# Patient Record
Sex: Male | Born: 1935 | Race: White | Hispanic: No | Smoking: Never smoker
Health system: Southern US, Community
[De-identification: ages and names within clinical notes are randomized; demographics above are authoritative.]

## PROBLEM LIST (undated history)

## (undated) DIAGNOSIS — I639 Cerebral infarction, unspecified: Secondary | ICD-10-CM

## (undated) DIAGNOSIS — E1165 Type 2 diabetes mellitus with hyperglycemia: Secondary | ICD-10-CM

## (undated) DIAGNOSIS — N401 Enlarged prostate with lower urinary tract symptoms: Secondary | ICD-10-CM

## (undated) DIAGNOSIS — R2681 Unsteadiness on feet: Secondary | ICD-10-CM

## (undated) DIAGNOSIS — F32A Depression, unspecified: Secondary | ICD-10-CM

## (undated) DIAGNOSIS — F329 Major depressive disorder, single episode, unspecified: Secondary | ICD-10-CM

## (undated) DIAGNOSIS — E785 Hyperlipidemia, unspecified: Secondary | ICD-10-CM

## (undated) DIAGNOSIS — G510 Bell's palsy: Secondary | ICD-10-CM

## (undated) DIAGNOSIS — M4850XA Collapsed vertebra, not elsewhere classified, site unspecified, initial encounter for fracture: Secondary | ICD-10-CM

## (undated) DIAGNOSIS — I739 Peripheral vascular disease, unspecified: Secondary | ICD-10-CM

## (undated) DIAGNOSIS — I2510 Atherosclerotic heart disease of native coronary artery without angina pectoris: Secondary | ICD-10-CM

## (undated) DIAGNOSIS — N183 Chronic kidney disease, stage 3 unspecified: Secondary | ICD-10-CM

## (undated) DIAGNOSIS — IMO0002 Reserved for concepts with insufficient information to code with codable children: Secondary | ICD-10-CM

## (undated) DIAGNOSIS — M1990 Unspecified osteoarthritis, unspecified site: Secondary | ICD-10-CM

## (undated) DIAGNOSIS — I10 Essential (primary) hypertension: Secondary | ICD-10-CM

## (undated) DIAGNOSIS — M159 Polyosteoarthritis, unspecified: Secondary | ICD-10-CM

## (undated) DIAGNOSIS — G4733 Obstructive sleep apnea (adult) (pediatric): Secondary | ICD-10-CM

## (undated) DIAGNOSIS — E039 Hypothyroidism, unspecified: Secondary | ICD-10-CM

## (undated) DIAGNOSIS — N529 Male erectile dysfunction, unspecified: Secondary | ICD-10-CM

## (undated) DIAGNOSIS — C801 Malignant (primary) neoplasm, unspecified: Secondary | ICD-10-CM

## (undated) DIAGNOSIS — N138 Other obstructive and reflux uropathy: Secondary | ICD-10-CM

## (undated) DIAGNOSIS — J8410 Pulmonary fibrosis, unspecified: Secondary | ICD-10-CM

## (undated) HISTORY — DX: Cerebral infarction, unspecified: I63.9

## (undated) HISTORY — DX: Malignant (primary) neoplasm, unspecified: C80.1

## (undated) HISTORY — DX: Obstructive sleep apnea (adult) (pediatric): G47.33

## (undated) HISTORY — DX: Unsteadiness on feet: R26.81

## (undated) HISTORY — DX: Major depressive disorder, single episode, unspecified: F32.9

## (undated) HISTORY — PX: APPENDECTOMY: SHX54

## (undated) HISTORY — DX: Peripheral vascular disease, unspecified: I73.9

## (undated) HISTORY — DX: Essential (primary) hypertension: I10

## (undated) HISTORY — DX: Collapsed vertebra, not elsewhere classified, site unspecified, initial encounter for fracture: M48.50XA

## (undated) HISTORY — DX: Pulmonary fibrosis, unspecified: J84.10

## (undated) HISTORY — DX: Hyperlipidemia, unspecified: E78.5

## (undated) HISTORY — DX: Other obstructive and reflux uropathy: N13.8

## (undated) HISTORY — DX: Bell's palsy: G51.0

## (undated) HISTORY — DX: Unspecified osteoarthritis, unspecified site: M19.90

## (undated) HISTORY — DX: Male erectile dysfunction, unspecified: N52.9

## (undated) HISTORY — DX: Hypothyroidism, unspecified: E03.9

## (undated) HISTORY — DX: Polyosteoarthritis, unspecified: M15.9

## (undated) HISTORY — DX: Chronic kidney disease, stage 3 (moderate): N18.3

## (undated) HISTORY — DX: Depression, unspecified: F32.A

## (undated) HISTORY — DX: Atherosclerotic heart disease of native coronary artery without angina pectoris: I25.10

## (undated) HISTORY — DX: Benign prostatic hyperplasia with lower urinary tract symptoms: N40.1

## (undated) HISTORY — PX: CHOLECYSTECTOMY: SHX55

## (undated) HISTORY — DX: Chronic kidney disease, stage 3 unspecified: N18.30

---

## 2000-08-11 ENCOUNTER — Emergency Department (HOSPITAL_COMMUNITY): Admission: EM | Admit: 2000-08-11 | Discharge: 2000-08-11 | Payer: Self-pay | Admitting: *Deleted

## 2000-08-11 ENCOUNTER — Encounter: Payer: Self-pay | Admitting: Emergency Medicine

## 2002-06-12 HISTORY — PX: COLONOSCOPY: SHX174

## 2004-01-05 ENCOUNTER — Emergency Department (HOSPITAL_COMMUNITY): Admission: EM | Admit: 2004-01-05 | Discharge: 2004-01-05 | Payer: Self-pay | Admitting: Emergency Medicine

## 2004-03-24 ENCOUNTER — Ambulatory Visit: Payer: Self-pay | Admitting: Family Medicine

## 2004-10-09 ENCOUNTER — Ambulatory Visit: Payer: Self-pay | Admitting: Family Medicine

## 2004-10-14 ENCOUNTER — Ambulatory Visit: Payer: Self-pay | Admitting: Family Medicine

## 2005-05-19 ENCOUNTER — Ambulatory Visit: Payer: Self-pay | Admitting: Family Medicine

## 2005-06-14 ENCOUNTER — Ambulatory Visit: Payer: Self-pay | Admitting: Internal Medicine

## 2005-07-01 ENCOUNTER — Ambulatory Visit: Payer: Self-pay | Admitting: Family Medicine

## 2005-08-19 ENCOUNTER — Ambulatory Visit: Payer: Self-pay | Admitting: Family Medicine

## 2005-10-21 ENCOUNTER — Inpatient Hospital Stay (HOSPITAL_COMMUNITY): Admission: EM | Admit: 2005-10-21 | Discharge: 2005-10-30 | Payer: Self-pay | Admitting: Emergency Medicine

## 2005-10-21 ENCOUNTER — Encounter (INDEPENDENT_AMBULATORY_CARE_PROVIDER_SITE_OTHER): Payer: Self-pay | Admitting: Specialist

## 2005-11-11 ENCOUNTER — Ambulatory Visit: Payer: Self-pay | Admitting: Family Medicine

## 2006-02-01 ENCOUNTER — Ambulatory Visit: Payer: Self-pay | Admitting: Family Medicine

## 2006-02-16 ENCOUNTER — Ambulatory Visit: Payer: Self-pay | Admitting: Family Medicine

## 2006-05-10 ENCOUNTER — Ambulatory Visit: Payer: Self-pay | Admitting: Family Medicine

## 2006-05-10 LAB — CONVERTED CEMR LAB
AST: 32 units/L (ref 0–37)
Basophils Relative: 0.4 % (ref 0.0–1.0)
CO2: 29 meq/L (ref 19–32)
Calcium: 9.4 mg/dL (ref 8.4–10.5)
Chol/HDL Ratio, serum: 4.2
Cholesterol: 162 mg/dL (ref 0–200)
Eosinophil percent: 3.1 % (ref 0.0–5.0)
HDL: 38.6 mg/dL — ABNORMAL LOW (ref >39.0)
LDL Cholesterol: 98 mg/dL (ref 0–99)
MCHC: 33.9 g/dL (ref 30.0–36.0)
MCV: 90.2 fL (ref 78.0–100.0)
Monocytes Absolute: 0.7 10*3/uL (ref 0.2–0.7)
Monocytes Relative: 12.5 % — ABNORMAL HIGH (ref 3.0–11.0)
Neutro Abs: 3.4 10*3/uL (ref 1.4–7.7)
TSH: 1.69 microintl units/mL (ref 0.35–5.50)
Triglyceride fasting, serum: 127 mg/dL (ref 0–149)
VLDL: 25 mg/dL (ref 0–40)
WBC: 5.5 10*3/uL (ref 4.5–10.5)

## 2006-08-28 ENCOUNTER — Emergency Department (HOSPITAL_COMMUNITY): Admission: EM | Admit: 2006-08-28 | Discharge: 2006-08-28 | Payer: Self-pay | Admitting: Emergency Medicine

## 2007-01-25 DIAGNOSIS — F329 Major depressive disorder, single episode, unspecified: Secondary | ICD-10-CM | POA: Insufficient documentation

## 2007-01-25 DIAGNOSIS — F3289 Other specified depressive episodes: Secondary | ICD-10-CM | POA: Insufficient documentation

## 2007-01-26 ENCOUNTER — Ambulatory Visit: Payer: Self-pay | Admitting: Family Medicine

## 2007-01-26 DIAGNOSIS — M1990 Unspecified osteoarthritis, unspecified site: Secondary | ICD-10-CM | POA: Insufficient documentation

## 2007-02-22 ENCOUNTER — Ambulatory Visit: Payer: Self-pay | Admitting: Family Medicine

## 2007-02-22 DIAGNOSIS — M5380 Other specified dorsopathies, site unspecified: Secondary | ICD-10-CM | POA: Insufficient documentation

## 2007-02-22 LAB — CONVERTED CEMR LAB
Protein, U semiquant: NEGATIVE
Urobilinogen, UA: 0.2

## 2007-05-18 ENCOUNTER — Ambulatory Visit: Payer: Self-pay | Admitting: Family Medicine

## 2007-05-18 DIAGNOSIS — E039 Hypothyroidism, unspecified: Secondary | ICD-10-CM | POA: Insufficient documentation

## 2007-05-18 DIAGNOSIS — E785 Hyperlipidemia, unspecified: Secondary | ICD-10-CM | POA: Insufficient documentation

## 2007-05-18 DIAGNOSIS — Z87898 Personal history of other specified conditions: Secondary | ICD-10-CM

## 2007-05-30 ENCOUNTER — Ambulatory Visit: Payer: Self-pay | Admitting: Family Medicine

## 2007-05-31 ENCOUNTER — Ambulatory Visit: Payer: Self-pay | Admitting: Family Medicine

## 2007-05-31 DIAGNOSIS — E119 Type 2 diabetes mellitus without complications: Secondary | ICD-10-CM | POA: Insufficient documentation

## 2007-05-31 LAB — CONVERTED CEMR LAB
Hgb A1c MFr Bld: 10.6 % — ABNORMAL HIGH (ref 4.6–6.0)
PSA: 0.44 ng/mL (ref 0.10–4.00)

## 2007-06-06 ENCOUNTER — Encounter: Payer: Self-pay | Admitting: Family Medicine

## 2007-06-26 ENCOUNTER — Encounter: Payer: Self-pay | Admitting: Family Medicine

## 2007-06-26 ENCOUNTER — Encounter: Admission: RE | Admit: 2007-06-26 | Discharge: 2007-07-12 | Payer: Self-pay | Admitting: Family Medicine

## 2007-07-12 ENCOUNTER — Encounter: Payer: Self-pay | Admitting: Family Medicine

## 2007-07-13 ENCOUNTER — Telehealth: Payer: Self-pay | Admitting: Family Medicine

## 2007-07-19 ENCOUNTER — Telehealth: Payer: Self-pay | Admitting: Family Medicine

## 2007-08-03 ENCOUNTER — Ambulatory Visit: Payer: Self-pay | Admitting: Family Medicine

## 2007-08-03 DIAGNOSIS — E669 Obesity, unspecified: Secondary | ICD-10-CM | POA: Insufficient documentation

## 2007-08-04 LAB — CONVERTED CEMR LAB: Hgb A1c MFr Bld: 7.7 % — ABNORMAL HIGH (ref 4.6–6.0)

## 2007-08-24 ENCOUNTER — Ambulatory Visit: Payer: Self-pay | Admitting: Family Medicine

## 2007-08-24 ENCOUNTER — Encounter: Admission: RE | Admit: 2007-08-24 | Discharge: 2007-08-24 | Payer: Self-pay | Admitting: Family Medicine

## 2007-08-24 DIAGNOSIS — IMO0002 Reserved for concepts with insufficient information to code with codable children: Secondary | ICD-10-CM

## 2007-08-24 DIAGNOSIS — S22009A Unspecified fracture of unspecified thoracic vertebra, initial encounter for closed fracture: Secondary | ICD-10-CM | POA: Insufficient documentation

## 2007-08-26 ENCOUNTER — Encounter: Admission: RE | Admit: 2007-08-26 | Discharge: 2007-08-26 | Payer: Self-pay | Admitting: Family Medicine

## 2007-08-28 ENCOUNTER — Telehealth: Payer: Self-pay | Admitting: Family Medicine

## 2007-08-28 ENCOUNTER — Encounter: Payer: Self-pay | Admitting: Family Medicine

## 2007-09-13 ENCOUNTER — Encounter: Payer: Self-pay | Admitting: Family Medicine

## 2007-09-26 ENCOUNTER — Ambulatory Visit: Payer: Self-pay | Admitting: Family Medicine

## 2007-09-26 DIAGNOSIS — R0602 Shortness of breath: Secondary | ICD-10-CM | POA: Insufficient documentation

## 2007-10-02 ENCOUNTER — Ambulatory Visit: Payer: Self-pay

## 2007-10-02 ENCOUNTER — Encounter: Payer: Self-pay | Admitting: Family Medicine

## 2007-10-17 ENCOUNTER — Ambulatory Visit: Payer: Self-pay | Admitting: Family Medicine

## 2007-10-17 DIAGNOSIS — R0902 Hypoxemia: Secondary | ICD-10-CM

## 2007-10-18 ENCOUNTER — Ambulatory Visit: Payer: Self-pay | Admitting: Family Medicine

## 2007-10-24 ENCOUNTER — Telehealth: Payer: Self-pay | Admitting: Family Medicine

## 2007-10-25 ENCOUNTER — Encounter: Admission: RE | Admit: 2007-10-25 | Discharge: 2007-10-25 | Payer: Self-pay | Admitting: Family Medicine

## 2007-10-25 ENCOUNTER — Ambulatory Visit: Payer: Self-pay | Admitting: Family Medicine

## 2007-10-26 ENCOUNTER — Ambulatory Visit: Payer: Self-pay | Admitting: Cardiology

## 2007-10-26 LAB — CONVERTED CEMR LAB
BUN: 18 mg/dL (ref 6–23)
Creatinine, Ser: 0.9 mg/dL (ref 0.4–1.5)

## 2007-10-31 ENCOUNTER — Telehealth: Payer: Self-pay | Admitting: Family Medicine

## 2007-11-01 ENCOUNTER — Ambulatory Visit: Payer: Self-pay | Admitting: Pulmonary Disease

## 2007-11-01 DIAGNOSIS — R930 Abnormal findings on diagnostic imaging of skull and head, not elsewhere classified: Secondary | ICD-10-CM

## 2007-11-02 ENCOUNTER — Telehealth (INDEPENDENT_AMBULATORY_CARE_PROVIDER_SITE_OTHER): Payer: Self-pay | Admitting: *Deleted

## 2007-11-09 ENCOUNTER — Ambulatory Visit: Payer: Self-pay | Admitting: Family Medicine

## 2007-11-09 LAB — CONVERTED CEMR LAB: Glucose, Bld: 117 mg/dL — ABNORMAL HIGH (ref 70–99)

## 2007-11-14 ENCOUNTER — Encounter: Payer: Self-pay | Admitting: Family Medicine

## 2007-11-16 ENCOUNTER — Ambulatory Visit: Payer: Self-pay | Admitting: Family Medicine

## 2007-12-07 ENCOUNTER — Ambulatory Visit: Payer: Self-pay | Admitting: Pulmonary Disease

## 2007-12-07 DIAGNOSIS — G4710 Hypersomnia, unspecified: Secondary | ICD-10-CM | POA: Insufficient documentation

## 2007-12-27 ENCOUNTER — Ambulatory Visit (HOSPITAL_BASED_OUTPATIENT_CLINIC_OR_DEPARTMENT_OTHER): Admission: RE | Admit: 2007-12-27 | Discharge: 2007-12-27 | Payer: Self-pay | Admitting: Pulmonary Disease

## 2007-12-27 ENCOUNTER — Encounter: Payer: Self-pay | Admitting: Pulmonary Disease

## 2008-01-16 ENCOUNTER — Telehealth (INDEPENDENT_AMBULATORY_CARE_PROVIDER_SITE_OTHER): Payer: Self-pay | Admitting: *Deleted

## 2008-01-21 ENCOUNTER — Ambulatory Visit: Payer: Self-pay | Admitting: Pulmonary Disease

## 2008-01-22 ENCOUNTER — Telehealth (INDEPENDENT_AMBULATORY_CARE_PROVIDER_SITE_OTHER): Payer: Self-pay | Admitting: *Deleted

## 2008-01-26 ENCOUNTER — Ambulatory Visit: Payer: Self-pay | Admitting: Pulmonary Disease

## 2008-01-26 DIAGNOSIS — G4733 Obstructive sleep apnea (adult) (pediatric): Secondary | ICD-10-CM

## 2008-02-12 ENCOUNTER — Telehealth (INDEPENDENT_AMBULATORY_CARE_PROVIDER_SITE_OTHER): Payer: Self-pay | Admitting: *Deleted

## 2008-02-12 ENCOUNTER — Ambulatory Visit: Payer: Self-pay | Admitting: Family Medicine

## 2008-02-15 ENCOUNTER — Ambulatory Visit: Payer: Self-pay | Admitting: Pulmonary Disease

## 2008-02-15 ENCOUNTER — Ambulatory Visit: Payer: Self-pay | Admitting: Family Medicine

## 2008-02-15 DIAGNOSIS — M2240 Chondromalacia patellae, unspecified knee: Secondary | ICD-10-CM

## 2008-02-15 LAB — CONVERTED CEMR LAB: Hgb A1c MFr Bld: 5.8 % (ref 4.6–6.0)

## 2008-02-20 ENCOUNTER — Telehealth: Payer: Self-pay | Admitting: Family Medicine

## 2008-02-26 ENCOUNTER — Telehealth: Payer: Self-pay | Admitting: Pulmonary Disease

## 2008-02-29 ENCOUNTER — Encounter: Admission: RE | Admit: 2008-02-29 | Discharge: 2008-02-29 | Payer: Self-pay | Admitting: Family Medicine

## 2008-04-11 ENCOUNTER — Ambulatory Visit: Payer: Self-pay | Admitting: Family Medicine

## 2008-04-11 DIAGNOSIS — F528 Other sexual dysfunction not due to a substance or known physiological condition: Secondary | ICD-10-CM | POA: Insufficient documentation

## 2008-05-01 ENCOUNTER — Ambulatory Visit: Payer: Self-pay | Admitting: Family Medicine

## 2008-05-07 ENCOUNTER — Ambulatory Visit: Payer: Self-pay | Admitting: Family Medicine

## 2008-05-13 ENCOUNTER — Ambulatory Visit: Payer: Self-pay | Admitting: Family Medicine

## 2008-05-22 ENCOUNTER — Ambulatory Visit: Payer: Self-pay | Admitting: Family Medicine

## 2008-06-19 ENCOUNTER — Encounter: Admission: RE | Admit: 2008-06-19 | Discharge: 2008-06-19 | Payer: Self-pay | Admitting: Family Medicine

## 2008-08-05 ENCOUNTER — Ambulatory Visit: Payer: Self-pay | Admitting: Pulmonary Disease

## 2008-08-13 ENCOUNTER — Ambulatory Visit: Payer: Self-pay | Admitting: Family Medicine

## 2008-08-13 ENCOUNTER — Telehealth: Payer: Self-pay | Admitting: Family Medicine

## 2008-08-21 ENCOUNTER — Ambulatory Visit: Payer: Self-pay | Admitting: Family Medicine

## 2008-08-21 DIAGNOSIS — M129 Arthropathy, unspecified: Secondary | ICD-10-CM | POA: Insufficient documentation

## 2008-08-21 DIAGNOSIS — M7660 Achilles tendinitis, unspecified leg: Secondary | ICD-10-CM | POA: Insufficient documentation

## 2008-08-21 LAB — CONVERTED CEMR LAB: Hgb A1c MFr Bld: 5.8 % (ref 4.6–6.5)

## 2008-09-17 ENCOUNTER — Encounter: Admission: RE | Admit: 2008-09-17 | Discharge: 2008-09-17 | Payer: Self-pay | Admitting: Family Medicine

## 2008-09-19 ENCOUNTER — Ambulatory Visit: Payer: Self-pay | Admitting: Family Medicine

## 2008-09-19 DIAGNOSIS — T50995A Adverse effect of other drugs, medicaments and biological substances, initial encounter: Secondary | ICD-10-CM

## 2008-09-19 DIAGNOSIS — K625 Hemorrhage of anus and rectum: Secondary | ICD-10-CM

## 2008-09-19 LAB — CONVERTED CEMR LAB
Blood in Urine, dipstick: NEGATIVE
Glucose, Urine, Semiquant: NEGATIVE
Ketones, urine, test strip: NEGATIVE
Urobilinogen, UA: 0.2

## 2008-10-08 LAB — CONVERTED CEMR LAB
ALT: 26 units/L (ref 0–53)
AST: 22 units/L (ref 0–37)
Basophils Absolute: 0 10*3/uL (ref 0.0–0.1)
Chloride: 105 meq/L (ref 96–112)
Creatinine, Ser: 1 mg/dL (ref 0.4–1.5)
Creatinine,U: 104.1 mg/dL
Eosinophils Absolute: 0.2 10*3/uL (ref 0.0–0.7)
Hgb A1c MFr Bld: 6.4 % (ref 4.6–6.5)
Lymphocytes Relative: 18.3 % (ref 12.0–46.0)
Microalb Creat Ratio: 4.8 mg/g (ref 0.0–30.0)
Microalb, Ur: 0.5 mg/dL (ref 0.0–1.9)
Neutro Abs: 3.3 10*3/uL (ref 1.4–7.7)
PSA: 0.68 ng/mL (ref 0.10–4.00)
Platelets: 132 10*3/uL — ABNORMAL LOW (ref 150.0–400.0)
RBC: 4.33 M/uL (ref 4.22–5.81)
RDW: 12.1 % (ref 11.5–14.6)
Sodium: 142 meq/L (ref 135–145)
TSH: 1.12 microintl units/mL (ref 0.35–5.50)
Total CHOL/HDL Ratio: 4
Total Protein: 6.9 g/dL (ref 6.0–8.3)
Triglycerides: 72 mg/dL (ref 0.0–149.0)
VLDL: 14.4 mg/dL (ref 0.0–40.0)

## 2008-10-16 ENCOUNTER — Encounter: Payer: Self-pay | Admitting: Family Medicine

## 2008-10-16 LAB — CONVERTED CEMR LAB: Vit D, 25-Hydroxy: 21 ng/mL — ABNORMAL LOW (ref 30–89)

## 2009-01-07 ENCOUNTER — Ambulatory Visit: Payer: Self-pay | Admitting: Family Medicine

## 2009-02-27 ENCOUNTER — Ambulatory Visit: Payer: Self-pay | Admitting: Family Medicine

## 2009-03-05 LAB — CONVERTED CEMR LAB: Hgb A1c MFr Bld: 6.2 % (ref 4.6–6.5)

## 2009-04-08 ENCOUNTER — Ambulatory Visit: Payer: Self-pay | Admitting: Family Medicine

## 2009-04-15 ENCOUNTER — Ambulatory Visit: Payer: Self-pay | Admitting: Family Medicine

## 2009-04-15 DIAGNOSIS — M19079 Primary osteoarthritis, unspecified ankle and foot: Secondary | ICD-10-CM | POA: Insufficient documentation

## 2009-04-16 ENCOUNTER — Telehealth: Payer: Self-pay | Admitting: Family Medicine

## 2009-04-29 ENCOUNTER — Ambulatory Visit: Payer: Self-pay | Admitting: Family Medicine

## 2009-05-04 LAB — CONVERTED CEMR LAB: Hgb A1c MFr Bld: 6.2 % (ref 4.6–6.5)

## 2009-08-12 ENCOUNTER — Ambulatory Visit: Payer: Self-pay | Admitting: Family Medicine

## 2009-09-23 ENCOUNTER — Ambulatory Visit: Payer: Self-pay | Admitting: Family Medicine

## 2009-09-23 DIAGNOSIS — R05 Cough: Secondary | ICD-10-CM

## 2009-09-23 DIAGNOSIS — R350 Frequency of micturition: Secondary | ICD-10-CM

## 2009-09-23 DIAGNOSIS — E559 Vitamin D deficiency, unspecified: Secondary | ICD-10-CM | POA: Insufficient documentation

## 2009-09-23 DIAGNOSIS — R059 Cough, unspecified: Secondary | ICD-10-CM | POA: Insufficient documentation

## 2009-11-11 ENCOUNTER — Ambulatory Visit: Payer: Self-pay | Admitting: Family Medicine

## 2009-11-11 DIAGNOSIS — B354 Tinea corporis: Secondary | ICD-10-CM | POA: Insufficient documentation

## 2009-11-19 ENCOUNTER — Ambulatory Visit: Payer: Self-pay | Admitting: Family Medicine

## 2009-11-19 DIAGNOSIS — L2089 Other atopic dermatitis: Secondary | ICD-10-CM

## 2009-11-27 ENCOUNTER — Telehealth: Payer: Self-pay | Admitting: Family Medicine

## 2009-12-25 ENCOUNTER — Ambulatory Visit: Payer: Self-pay | Admitting: Family Medicine

## 2009-12-25 ENCOUNTER — Telehealth: Payer: Self-pay | Admitting: Family Medicine

## 2009-12-30 ENCOUNTER — Encounter: Payer: Self-pay | Admitting: Family Medicine

## 2009-12-30 LAB — CONVERTED CEMR LAB: Hgb A1c MFr Bld: 6.5 % (ref 4.6–6.5)

## 2010-03-04 ENCOUNTER — Ambulatory Visit: Payer: Self-pay | Admitting: Family Medicine

## 2010-03-04 DIAGNOSIS — M129 Arthropathy, unspecified: Secondary | ICD-10-CM

## 2010-03-04 DIAGNOSIS — M25559 Pain in unspecified hip: Secondary | ICD-10-CM

## 2010-03-04 DIAGNOSIS — R109 Unspecified abdominal pain: Secondary | ICD-10-CM

## 2010-03-04 DIAGNOSIS — R269 Unspecified abnormalities of gait and mobility: Secondary | ICD-10-CM

## 2010-03-04 LAB — CONVERTED CEMR LAB
Bilirubin Urine: NEGATIVE
Blood in Urine, dipstick: NEGATIVE
Ketones, urine, test strip: NEGATIVE
Protein, U semiquant: NEGATIVE
Specific Gravity, Urine: 1.02
WBC Urine, dipstick: NEGATIVE

## 2010-03-09 LAB — CONVERTED CEMR LAB
ALT: 21 U/L
AST: 21 U/L
Albumin: 3.9 g/dL
Alkaline Phosphatase: 79 U/L
BUN: 18 mg/dL
Basophils Absolute: 0 K/uL
Basophils Relative: 0.4 %
Bilirubin, Direct: 0.1 mg/dL
CO2: 31 meq/L
Calcium: 9 mg/dL
Chloride: 104 meq/L
Creatinine, Ser: 0.9 mg/dL
Eosinophils Absolute: 0.2 K/uL
Eosinophils Relative: 3.3 %
GFR calc non Af Amer: 86.1 mL/min
Glucose, Bld: 133 mg/dL — ABNORMAL HIGH
HCT: 40.7 %
Hemoglobin: 13.9 g/dL
Lymphocytes Relative: 22.7 %
Lymphs Abs: 1.4 K/uL
MCHC: 34.3 g/dL
MCV: 93.8 fL
Monocytes Absolute: 0.6 K/uL
Monocytes Relative: 10.5 %
Neutro Abs: 3.8 K/uL
Neutrophils Relative %: 63.1 %
Phosphorus: 2.9 mg/dL
Platelets: 170 K/uL
Potassium: 4.6 meq/L
RBC: 4.34 M/uL
RDW: 13.4 %
Sodium: 140 meq/L
TSH: 1.16 u[IU]/mL
Total Bilirubin: 0.8 mg/dL
Total Protein: 6.7 g/dL
WBC: 6.1 10*3/microliter

## 2010-03-10 ENCOUNTER — Encounter
Admission: RE | Admit: 2010-03-10 | Discharge: 2010-03-24 | Payer: Self-pay | Source: Home / Self Care | Admitting: Family Medicine

## 2010-03-20 ENCOUNTER — Ambulatory Visit: Payer: Self-pay | Admitting: Cardiovascular Disease

## 2010-03-20 DIAGNOSIS — I739 Peripheral vascular disease, unspecified: Secondary | ICD-10-CM

## 2010-03-20 DIAGNOSIS — I10 Essential (primary) hypertension: Secondary | ICD-10-CM

## 2010-03-24 ENCOUNTER — Ambulatory Visit: Payer: Self-pay | Admitting: Family Medicine

## 2010-05-21 ENCOUNTER — Encounter: Payer: Self-pay | Admitting: Family Medicine

## 2010-05-31 LAB — CONVERTED CEMR LAB
ALT: 17 U/L
ALT: 43 U/L
AST: 18 U/L
AST: 33 U/L
Albumin: 3.8 g/dL
Albumin: 4 g/dL
Alkaline Phosphatase: 70 U/L
Alkaline Phosphatase: 73 U/L
BUN: 18 mg/dL
BUN: 23 mg/dL
Basophils Absolute: 0 K/uL
Basophils Absolute: 0 K/uL
Basophils Relative: 0.1 %
Basophils Relative: 0.5 %
Bilirubin Urine: NEGATIVE
Bilirubin, Direct: 0.1 mg/dL
Bilirubin, Direct: 0.1 mg/dL
Blood in Urine, dipstick: NEGATIVE
Blood in Urine, dipstick: NEGATIVE
CO2: 26 meq/L
CO2: 27 meq/L
Calcium: 9.2 mg/dL
Calcium: 9.3 mg/dL
Chloride: 104 meq/L
Chloride: 109 meq/L
Cholesterol: 153 mg/dL
Cholesterol: 176 mg/dL
Creatinine, Ser: 0.9 mg/dL
Creatinine, Ser: 0.9 mg/dL
Creatinine,U: 150.4 mg/dL
Eosinophils Absolute: 0.2 K/uL
Eosinophils Absolute: 0.2 K/uL
Eosinophils Relative: 3.4 %
Eosinophils Relative: 4.4 %
GFR calc Af Amer: 105 mL/min
GFR calc non Af Amer: 87 mL/min
GFR calc non Af Amer: 90.84 mL/min
Glucose, Bld: 120 mg/dL — ABNORMAL HIGH
Glucose, Bld: 255 mg/dL — ABNORMAL HIGH
Glucose, Urine, Semiquant: 100
HCT: 38.9 % — ABNORMAL LOW
HCT: 40 %
HDL: 30.9 mg/dL — ABNORMAL LOW
HDL: 34 mg/dL — ABNORMAL LOW
Hemoglobin: 13.6 g/dL
Hemoglobin: 14 g/dL
Hgb A1c MFr Bld: 6.5 %
Ketones, urine, test strip: NEGATIVE
LDL Cholesterol: 101 mg/dL — ABNORMAL HIGH
LDL Cholesterol: 123 mg/dL — ABNORMAL HIGH
Lymphocytes Relative: 20.5 %
Lymphocytes Relative: 21.6 %
Lymphs Abs: 1.2 K/uL
MCHC: 34.9 g/dL
MCHC: 34.9 g/dL
MCV: 89.9 fL
MCV: 93.1 fL
Microalb Creat Ratio: 0.8 mg/g
Microalb, Ur: 1.2 mg/dL
Monocytes Absolute: 0.6 K/uL
Monocytes Absolute: 0.7 K/uL
Monocytes Relative: 10.5 %
Monocytes Relative: 11.6 %
Neutro Abs: 3.5 K/uL
Neutro Abs: 3.5 K/uL
Neutrophils Relative %: 61.9 %
Neutrophils Relative %: 65.5 %
Nitrite: NEGATIVE
Nitrite: NEGATIVE
PSA: 0.7 ng/mL
Platelets: 161 K/uL
Platelets: 174 K/uL
Potassium: 4.8 meq/L
Potassium: 5.2 meq/L — ABNORMAL HIGH
Protein, U semiquant: NEGATIVE
RBC: 4.18 M/uL — ABNORMAL LOW
RBC: 4.45 M/uL
RDW: 12.5 %
RDW: 13.9 %
Sodium: 139 meq/L
Sodium: 142 meq/L
Specific Gravity, Urine: 1.025
TSH: 1.12 u[IU]/mL
TSH: 1.33 u[IU]/mL
Testosterone: 369.78 ng/dL
Total Bilirubin: 0.7 mg/dL
Total Bilirubin: 0.8 mg/dL
Total CHOL/HDL Ratio: 5
Total CHOL/HDL Ratio: 5.7
Total Protein: 6.8 g/dL
Total Protein: 7.2 g/dL
Triglycerides: 113 mg/dL
Triglycerides: 89 mg/dL
Urobilinogen, UA: 0.2
Urobilinogen, UA: 0.2
VLDL: 17.8 mg/dL
VLDL: 23 mg/dL
WBC Urine, dipstick: NEGATIVE
WBC Urine, dipstick: NEGATIVE
WBC: 5.4 10*3/microliter
WBC: 5.7 10*3/microliter
pH: 5
pH: 5.5

## 2010-06-04 NOTE — Assessment & Plan Note (Signed)
Summary: RASH IN GENITAL AREA/CJR   Vital Signs:  Patient profile:   77 year old male Weight:      242 pounds O2 Sat:      97 % Temp:     97.4 degrees F Pulse rate:   87 / minute Pulse rhythm:   regular BP sitting:   116 / 74  (left arm)  Vitals Entered By: Pura Spice, RN (November 11, 2009 4:44 PM) CC: rash testicles    History of Present Illness: This 77 year old white overweight male known diabetic is in complaining of a rash of his testicle off and on for some time but increasing in severity  Blood pressure control CBGs 9110 Relates he is doing her well without any new problem  Allergies (verified): No Known Drug Allergies  Past History:  Past Medical History: Last updated: 11/01/2007   SOB (ICD-786.05) ABRASION, ARM (ICD-919.0) COMPRESSION FRACTURE, THORACIC VERTEBRA (ICD-805.2) EXOGENOUS OBESITY (ICD-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 pt has 3 children. Never Smoked Alcohol use-yes  Risk Factors: Smoking Status: never (01/25/2007)  Review of Systems      See HPI  The patient denies anorexia, fever, weight loss, weight gain, vision loss, decreased hearing, hoarseness, chest pain, syncope, dyspnea on exertion, peripheral edema, prolonged cough, headaches, hemoptysis, abdominal pain, melena, hematochezia, severe indigestion/heartburn, hematuria, incontinence, genital sores, muscle weakness, suspicious skin  lesions, transient blindness, difficulty walking, depression, unusual weight change, abnormal bleeding, enlarged lymph nodes, angioedema, breast masses, and testicular masses.    Physical Exam  General:  Well-developed,well-nourished,in no acute distress; alert,appropriate and cooperative throughout examinationoverweight-appearing.   Lungs:  Normal respiratory effort, chest expands symmetrically. Lungs are clear to auscultation, no crackles or wheezes. Heart:  Normal rate and regular rhythm. S1 and S2 normal without gallop, murmur, click, rub or other extra sounds. Skin:  focal type rash slightly scaly over the testicle with some erythema   Impression & Recommendations:  Problem # 1:  TINEA CORPORIS (ICD-110.5) Assessment New  Orders: Prescription Created Electronically [phone])  Problem # 2:  ARTHRITIS (ICD-716.90) Assessment: Improved  Problem # 3:  ERECTILE DYSFUNCTION (ICD-302.72) Assessment: Unchanged  Problem # 4:  DIABETES MELLITUS, TYPE II (ICD-250.00) Assessment: Improved  His updated medication list for this problem includes:    Adult Aspirin Low Strength 81 Mg Tbdp (Aspirin) .[name]... 3xweek    Glucotrol 10 Mg Tabs (Glipizide) .[name]... Take 1 tablet by mouth two times a day    Lisinopril 20 Mg Tabs (Lisinopril) .[name]... 1 once daily for diabetic treatment  Complete Medication List: 1)  Flomax 0.4 Mg Cp24 (Tamsulosin hcl) .... Once daily 2)  Adult Aspirin Low Strength 81 Mg Tbdp (Aspirin) .... 3xweek 3)  Glucotrol 10 Mg Tabs (Glipizide) .... Take 1 tablet by mouth two times a day 4)  Accu-chek Aviva Strp (Glucose blood) .... Bid 5)  Accu-chek Multiclix Lancets Misc (Lancets) .... Test as directed. 6)  Lisinopril 20 Mg Tabs (Lisinopril) .[name].. 1 once daily for diabetic treatment 7)  Sertraline Hcl 100 Mg Tabs (Sertraline hcl) .... Once daily 8)  Accu-chek Aviva Soln (Blood glucose calibration) .... Use with  accu chek aviva glucometer for calibration 9)  Nabumetone 750 Mg Tabs  (Nabumetone) .[name].. 1 two times a day for arthritis 10)  1 Pr Diabetic Shoes  .... Asinstructed 11)  Cortisporin 3.5-10000-1 Soln (Neomycin-polymyxin-hc) .... 4 qtts in ear three times a day for external otitis 12)  Doxycycline Hyclate 100 Mg Tabs (Doxycycline hyclate) .[name].. 1 bid 13)  Hydrocodone-homatropine 5-1.5 Mg/60ml Syrp (Hydrocodone-homatropine) .[name].. 1-2 tsp q4h as needed for cough 14)  Mucinex D [phone] Mg Xr12h-tab (Pseudoephedrine-guaifenesin) .[name].. 1 bid 15)  Vitamin D (ergocalciferol) 50000 Unit Caps (Ergocalciferol) .[name].. 1 cap weel;y for 12 weeks 16)  Lotrisone 1-0.05 % Crea (Clotrimazole-betamethasone) .... Apply to rash thinly but thoroughly for 14 days, repeat if needed  Patient Instructions: 1)  fungal infection or tinea corporis 2)  Plan Lotrisone cream twice daily for 2 weeks apply tender but thoroughly 3)  Layoff pajama bottoms were short night Prescriptions: LOTRISONE 1-0.05 % CREA (CLOTRIMAZOLE-BETAMETHASONE) apply to rash thinly but thoroughly for 14 days, repeat if needed  #60 gms x 5   Entered and Authorized by:   [name] MD   Signed by:   [name] MD on 11/11/2009   Method used:   Electronically to        [facility]  [phone]* (retail)       [address] [place], [place]  [address]       Ph: [phone] or [phone]       Fax: [phone]   RxID:   [id number]

## 2010-06-04 NOTE — Miscellaneous (Signed)
Summary: Initial Summary for PT Services/[facility]  Initial Summary for PT Services/[facility]   Imported By: Maryln Gottron 05/29/2010 14:02:22  _____________________________________________________________________  External Attachment:    Type:   Image     Comment:   External Document

## 2010-06-04 NOTE — Assessment & Plan Note (Signed)
Summary: FASTING CPX/RCD   Vital Signs:  Patient profile:   77 year old male Height:      70 inches Weight:      240 pounds BMI:     34.56 Temp:     97.4 degrees F Pulse rate:   60 / minute BP sitting:   110 / 74  (left arm)  Vitals Entered By: Pura Spice, RN (Sep 23, 2009 8:49 AM) CC: refill meds and go over problmes and pt is fasting.  Is Patient Diabetic? Yes Did you bring your meter with you today? No   History of Present Illness: This 77 year old white obese male is into to discuss his medical problems and then refill necessary medication he as a problem with obesity but I are as been dieting and has lost 8 pounds. He has been depressed since his wife expired but now it is basically happy and has overcome some of the depression. He relates he feels very tired and gets tired easily over the last 3-4 months. He has a slight hacking cough which is persistent but of no problem has no nocturia rectal bowel have its and no urinary complaints colonoscopic exam is due in 2014 Immunizations are up to date Relating to his depression he has been on sertraline and we both feel we should continue this in regard to his persisting hacking cough we feel he should have a chest x-ray since he has not had one in some time  EKG  Procedure date:  09/23/2009  Findings:      inus rhythm with rate of:  57 sinus bradycardia   Allergies: No Known Drug Allergies  Past History:  Past Medical History: Last updated: 11/01/2007   SOB (ICD-786.05) ABRASION, ARM (ICD-919.0) COMPRESSION FRACTURE, THORACIC VERTEBRA (ICD-805.2) EXOGENOUS OBESITY (ICD-278.00) DIABETES MELLITUS, TYPE II (ICD-250.00) ANEMIA (ICD-285.9) BENIGN PROSTATIC HYPERTROPHY, HX OF (ICD-V13.8) HYPERLIPIDEMIA (ICD-272.4) HYPOTHYROIDISM (ICD-244.9) MUSCLE SPASM, LUMBAR REGION (ICD-724.8) UTI (ICD-599.0) OSTEOARTHRITIS (ICD-715.90) TINEA CORPORIS (ICD-110.5) DEPRESSION (ICD-311) FAMILY HISTORY DIABETES 1ST DEGREE RELATIVE  (ICD-V18.0)    Past Surgical History: Last updated: 01/25/2007 Colonoscopy Appendectomy Transurethral resection of prostate  Social History: Last updated: 11/01/2007 Retired from [employer].  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 pt has 3 children. Never Smoked Alcohol use-yes  Risk Factors: Smoking Status: never (01/25/2007)  Review of Systems  The patient denies anorexia, fever, weight loss, weight gain, vision loss, decreased hearing, hoarseness, chest pain, syncope, dyspnea on exertion, peripheral edema, prolonged cough, headaches, hemoptysis, abdominal pain, melena, hematochezia, severe indigestion/heartburn, hematuria, incontinence, genital sores, muscle weakness, suspicious skin lesions, transient blindness, difficulty walking, depression, unusual weight change, abnormal bleeding, enlarged lymph nodes, angioedema, breast masses, and testicular masses.    Physical Exam  General:  Well-developed,well-nourished,in no acute distress; alert,appropriate and cooperative throughout examinationoverweight-appearing.   Head:  Normocephalic and atraumatic without obvious abnormalities. No apparent alopecia or balding. Eyes:  No corneal or conjunctival inflammation noted. EOMI. Perrla. Funduscopic exam benign, without hemorrhages, exudates or papilledema. Vision grossly normal. Ears:  External ear exam shows no significant lesions or deformities.  Otoscopic examination reveals clear canals, tympanic membranes are intact bilaterally without bulging, retraction, inflammation or discharge. Hearing is grossly normal bilaterally. Nose:  External nasal examination shows no deformity or inflammation. Nasal mucosa are pink and moist without lesions or exudates. Mouth:  Oral mucosa and oropharynx without lesions or exudates.  Teeth in good repair. Neck:  No deformities, masses, or tenderness noted. Chest Wall:  No deformities, masses, tenderness or gynecomastia  noted. Breasts:  No  masses or gynecomastia noted Lungs:  Normal respiratory effort, chest expands symmetrically. Lungs are clear to auscultation, no crackles or wheezes. Heart:  Normal rate and regular rhythm. S1 and S2 normal without gallop, murmur, click, rub or other extra sounds. EKG is normal with sinus bradycardia Abdomen:  obese liver spleen and kidneys are nonpalpable normal bowel sounds Rectal:  No external abnormalities noted. Normal sphincter tone. No rectal masses or tenderness. Genitalia:  Testes bilaterally descended without nodularity, tenderness or masses. No scrotal masses or lesions. No penis lesions or urethral discharge. Prostate:  1+ enlarged.   Msk:  No deformity or scoliosis noted of thoracic or lumbar spine.   Pulses:  R and L carotid,radial,femoral,dorsalis pedis and posterior tibial pulses are full and equal bilaterally Extremities:  No clubbing, cyanosis, edema, or deformity noted with normal full range of motion of all joints.   Neurologic:  No cranial nerve deficits noted. Station and gait are normal. Plantar reflexes are down-going bilaterally. DTRs are symmetrical throughout. Sensory, motor and coordinative functions appear intact. Skin:  Intact without suspicious lesions or rashes Cervical Nodes:  No lymphadenopathy noted Axillary Nodes:  No palpable lymphadenopathy Inguinal Nodes:  No significant adenopathy Psych:  Cognition and judgment appear intact. Alert and cooperative with normal attention span and concentration. 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 3xweek    Nabumetone 750 Mg Tabs (Nabumetone) .[name]... 1 two times a day for arthritis  Problem # 6:  OBSTRUCTIVE SLEEP APNEA (ICD-327.23) Assessment: Unchanged  Problem # 7:  DYSPNEA (ICD-786.05) Assessment: Unchanged obesity, lack fracture  Problem # 8:  COMPRESSION FRACTURE, THORACIC VERTEBRA (ICD-805.2) Assessment: Improved  Problem # 9:  EXOGENOUS OBESITY (ICD-278.00) Assessment: Deteriorated weight reduction diet increase activity or exercise  Problem # 10:  DIABETES MELLITUS, TYPE II (ICD-250.00) Assessment: Improved  His updated medication list for this problem includes:    Adult Aspirin Low Strength 81 Mg Tbdp (Aspirin) .[name]... 3xweek    Glucotrol 10 Mg Tabs (Glipizide) .[name]... Take 1 tablet by mouth two times a day    Lisinopril 20 Mg Tabs (Lisinopril) .[name]... 1 once daily for diabetic treatment  Orders: EKG w/ Interpretation (93000) TLB-A1C / Hgb A1C (Glycohemoglobin) (83036-A1C) TLB-Microalbumin/Creat Ratio, Urine (82043-MALB)  Complete Medication List: 1)  Flomax 0.4 Mg Cp24 (Tamsulosin hcl) .... Once daily 2)  Adult Aspirin Low Strength 81 Mg Tbdp (Aspirin) .... 3xweek 3)  Glucotrol 10 Mg Tabs (Glipizide) .... Take 1 tablet by mouth two times a day 4)  Accu-chek Aviva Strp (Glucose blood) .... Bid 5)  Accu-chek Multiclix Lancets Misc (Lancets) .... Test as directed. 6)  Lisinopril 20 Mg Tabs (Lisinopril) .[name].. 1 once daily for diabetic treatment 7)  Sertraline Hcl 100 Mg Tabs (Sertraline hcl) .... Once daily 8)  Accu-chek Aviva Soln (Blood glucose calibration) .... Use with accu chek aviva glucometer for calibration 9)  Nabumetone 750 Mg Tabs (Nabumetone) .[name].. 1 two times a day for arthritis 10)  1 Pr Diabetic Shoes  .Marland KitchenMarland KitchenMarland Kitchen  Asinstructed 11)  Cortisporin 3.5-10000-1 Soln (Neomycin-polymyxin-hc) .... 4 qtts in ear  three times a day for external otitis 12)  Doxycycline Hyclate 100 Mg Tabs (Doxycycline hyclate) .[name].. 1 bid 13)  Hydrocodone-homatropine 5-1.5 Mg/86ml Syrp (Hydrocodone-homatropine) .[name].. 1-2 tsp q4h as needed for cough 14)  Mucinex D [phone] Mg Xr12h-tab (Pseudoephedrine-guaifenesin) .[name].. 1 bid 15)  Vitamin D (ergocalciferol) 50000 Unit Caps (Ergocalciferol) .[name].. 1 cap weel;y for 12 weeks  Other Orders: Venipuncture ([address]) T-Vitamin D (25-Hydroxy) ([address]) UA Dipstick w/o Micro (automated)  (81003) TLB-Lipid Panel (80061-LIPID) TLB-BMP (Basic Metabolic Panel-BMET) (80048-METABOL) TLB-CBC Platelet - w/Differential (85025-CBCD) TLB-Hepatic/Liver Function Pnl (80076-HEPATIC) TLB-TSH (Thyroid Stimulating Hormone) (84443-TSH) TLB-PSA (Prostate Specific Antigen) (84153-PSA) Prescription Created Electronically [phone])  Patient Instructions: 1)  physical examination in general good except needs to restart weight reduction diet and continue exercise 2)  2 get chest x-ray 3)  Continue other medications which I will refill Prescriptions: VITAMIN D (ERGOCALCIFEROL) 50000 UNIT CAPS (ERGOCALCIFEROL) 1 cap weel;y for 12 weeks  #12 x 11   Entered and Authorized by:   [name] MD   Signed by:   [name] MD on 09/30/2009   Method used:   Electronically to        [facility]  [phone]* (retail)       [address]       [place], [place]  [address]       Ph: [phone] or [phone]       Fax: [phone]   RxID:   [id number] GLUCOTROL 10 MG  TABS (GLIPIZIDE) Take 1 tablet by mouth two times a day  #60 x 11   Entered by:   Pura Spice, RN   Authorized by:   [name] MD   Signed by:   [name] MD on 09/30/2009   Method used:   Electronically to        [facility]  [phone]* (retail)       [address]       [place], [place]  [address]       Ph: [phone] or  [phone]       Fax: [phone]   RxID:   [id number] CORTISPORIN 3.5-10000-1 SOLN [facility]) 4 qtts in ear three times a day for external otitis  #5.0 cc x 3   Entered and Authorized by:   [name] MD   Signed by:   [name] MD on 09/23/2009   Method used:   Electron[facility] to        [facility]  [phone]* (retail)       [address] [place], [place]  [address]       Ph: [phone] or [phone]       Fax: [phone]   RxID:   [id number] ACCU-CHEK MULTICLIX LANCETS   MISC (LANCETS) test as directed.  #106 x 4   Entered and Authorized by:   [name] MD   Signed by:   [name] MD on 09/23/2009   Method used:   Electronically to        [facility]  [phone]* (retail)       [address] [place], [place]  [address]       Ph: [phone]  or [phone]       Fax: [phone]   RxID:   [id number] ACCU-CHEK AVIVA   STRP (GLUCOSE BLOOD) bid  #200 x 11   Entered and Authorized by:   [name] MD   Signed by:   [name] MD on 09/23/2009   Method used:   Electronically to        [facility]  [phone]* (retail)       [address] [place], [place]  [address]       Ph: [phone] or [phone]       Fax: [phone]   RxID:   [id number] LISINOPRIL 20 MG  TABS (LISINOPRIL) 1 once daily for diabetic treatment  #30 x 11   Entered and Authorized by:   [name] MD   Signed by:   [name] MD on 09/23/2009   Method used:   Electronically to        [facility]  [phone]* (retail)       [address] [place], [place]  [address]       Ph: [phone] or [phone]       Fax: [phone]   RxID:   [id number] FLOMAX 0.4 MG  CP24 (TAMSULOSIN HCL) once daily  #30 x 11   Entered and Authorized by:   [name] MD   Signed by:   [name] MD on 09/23/2009   Method used:   Electronically to         [facility]  [phone]* (retail)       [address] [place], [place]  [address]       Ph: [phone] or [phone]       Fax: [phone]   RxID:   [id number] SERTRALINE HCL 100 MG TABS (SERTRALINE HCL) once daily  #30 x 0   Entered and Authorized by:   [name] MD   Signed by:   [name] MD on 09/23/2009   Method used:   Electronically to        [facility]  [phone]* (retail)       [address] [place], [place]  [address]       Ph: [phone] or [phone]       Fax: [phone]   RxID:   [id number] LISINOPRIL 20 MG  TABS (LISINOPRIL) 1 once daily for diabetic treatment  #60 x 11   Entered and Authorized by:   [name] MD   Signed by:   [name] MD on 09/23/2009   Method used:   Electronically to        [facility]  [phone]* (retail)       [address]       [place], [place]  [address]       Ph: [phone] or [phone]       Fax: [phone]   RxID:   [id number]    Immunization History:  Pneumovax Immunization History:    Pneumovax:  historical (05/03/2004) per pt had pneunovax about 5 yrs ago    Laboratory Results   Urine Tests  Date/Time Recieved: Sep 23, 2009 10:30 AM  Date/Time Reported: Sep 23, 2009 10:30 AM  Routine Urinalysis   Color: yellow Appearance: Clear Glucose: 100   (Normal Range: Negative) Bilirubin: negative   (Normal Range: Negative) Ketone: negative   (Normal Range: Negative) Spec. Gravity: >=1.030   (Normal Range: 1.003-1.035) Blood: negative   (Normal Range: Negative) pH: 5.0   (Normal Range: 5.0-8.0) Protein: trace   (Normal Range: Negative) Urobilinogen: 0.2   (Normal Range: 0-1) Nitrite: negative   (Normal Range: Negative) Leukocyte Esterace: negative   (Normal Range: Negative)    Comments: Wynona Canes, CMA  Sep 23, 2009 10:30 AM

## 2010-06-04 NOTE — Assessment & Plan Note (Signed)
Summary: np3/diabetic/sec. 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  He has no historyh of vascular disease.  Non smoker with LDL of 111.  His leg pain and weakness is clearly orthopedic in nature.  His biggest problem is going from sitting to standing.  He has no claudication symptoms in the calf or buttocks.  I explained to him that all octogenarians have some calcification in there LE arteries.  He had a heart cath back in 1996 which I reviewed pictures of No significant disease.  Denies TIA, SSCP, dyspnea, palpitations, abdominal pain or LE ulcers.  On ACE for BP and DM.    Current Problems (verified): 1)  Sob  (ICD-786.05) 2)  Hyperlipidemia  (ICD-272.4) 3)  Ct, Chest, Abnormal  (ICD-793.1) 4)  Flank Pain, Right  (ICD-789.09) 5)  Unsteady Gait  (ICD-781.2) 6)  Hip Pain, Right  (ICD-719.45) 7)  Arthritis, Knees, Bilateral  (ICD-716.98) 8)  Dermatitis, Atopic  (ICD-691.8) 9)  Tinea Corporis  (ICD-110.5) 10)  Cough  (ICD-786.2) 11)  Urinary Frequency  (ICD-788.41) 12)  Vitamin D Deficiency  (ICD-268.9) 13)  Special Screening Malignant Neoplasm of Prostate  (ICD-V76.44) 14)  Arthritis, Left Foot  (ICD-716.97) 15)  Rectal Bleeding  (ICD-569.3) 16)  Special Screening Malignant Neoplasm of Prostate  (ICD-V76.44) 17)  Uns Advrs Eff Oth Rx Medicinal&biological Sbstnc  (ICD-995.29) 18)  Arthritis  (ICD-716.90) 19)  Achilles Tendinitis  (ICD-726.71) 20)  Erectile Dysfunction  (ICD-302.72) 21)  Chondromalacia Patella, Bilateral  (ICD-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 Once Daily 2)  Adult Aspirin Low Strength 81 Mg  Tbdp (Aspirin) .... 3xweek 3)  Glucotrol 10 Mg  Tabs (Glipizide) .... Take 1 Tablet By Mouth Two Times A Day 4)  Accu-Chek Aviva   Strp (Glucose Blood) .... Bid 5)  Accu-Chek Multiclix Lancets   Misc (Lancets) .... Test As Directed. 6)  Lisinopril 20 Mg  Tabs (Lisinopril) .[name].. 1 Once Daily For Diabetic Treatment 7)  Sertraline Hcl 100 Mg Tabs (Sertraline Hcl) .... Once Daily 8)  Accu-Chek Aviva  Soln (Blood Glucose Calibration) .... Use With Accu Chek Aviva Glucometer For Calibration 9)  Nabumetone 750 Mg Tabs (Nabumetone) .[name].. 1 Two Times A Day For Arthritis 10)  1 Pr Diabetic Shoes .... Asinstructed  Allergies (verified): No Known Drug Allergies  Past History:  Past Medical History: Last updated: 03/19/2010 SOB HYPERLIPIDEMIA  CT, CHEST, ABNORMAL  FLANK PAIN, RIGHT UNSTEADY GAIT  HIP PAIN, RIGHT  ARTHRITIS, KNEES, BILATERAL DERMATITIS, ATOPIC  TINEA CORPORIS COUGH URINARY FREQUENCY VITAMIN D DEFICIENCY SPECIAL SCREENING MALIGNANT NEOPLASM OF PROSTATE ARTHRITIS, LEFT FOOT  RECTAL BLEEDING  SPECIAL SCREENING MALIGNANT NEOPLASM OF PROSTATE  UNS ADVRS EFF OTH RX MEDICINAL&BIOLOGICAL SBSTNC ARTHRITIS ACHILLES TENDINITIS ERECTILE DYSFUNCTION  CHONDROMALACIA PATELLA, BILATERAL  OBSTRUCTIVE SLEEP APNEA  HYPERSOMNIA DYSPNEA HYPOXEMIA  ABRASION, ARM  COMPRESSION FRACTURE, THORACIC VERTEBRA  EXOGENOUS OBESITY DIABETES MELLITUS, TYPE II  BENIGN PROSTATIC HYPERTROPHY, HX OF HYPOTHYROIDISM  MUSCLE SPASM, LUMBAR REGION  OSTEOARTHRITIS  DEPRESSION  Past Surgical History: Last  updated: 01/25/2007 Colonoscopy Appendectomy Transurethral resection of prostate  Family History: Last updated: 11/01/2007 Family History of Arthritis Family History Diabetes 1st degree relative Family History of Cardiovascular disorder   heart disease: mother cancer: sister (pancreatic) father died of an abdominal aneurysm  Social History: Last updated: 11/01/2007 Retired from [employer].  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 pt has 3 children. Never Smoked Alcohol use-yes  Review of Systems       Denies fever, malais, weight loss, blurry vision, decreased visual acuity, cough, sputum, SOB, hemoptysis, pleuritic pain, palpitaitons, heartburn, abdominal pain, melena, lower extremity edema, claudication, or rash.   Vital Signs:  Patient profile:   77 year old male Height:      70 inches Weight:      239 pounds BMI:     34.42 Pulse rate:   69 / minute Resp:     14 per minute BP sitting:   134 / 65  (left arm)  Vitals Entered By: Kem Parkinson (March 20, 2010 1:47 PM)  Physical Exam  General:  Affect appropriate Healthy:  appears stated age HEENT: normal Neck supple with no adenopathy JVP normal no bruits no thyromegaly Lungs clear with no wheezing and good diaphragmatic motion Heart:  S1/S2 no murmur,rub, gallop or click PMI normal Abdomen: benighn, BS positve, no tenderness, no AAA no bruit.  No HSM or HJR Distal pulses intact with no bruits No edema Neuro non-focal Skin warm and dry Vasc:  Has brisk Plus 4 bilateral femoral and popliteal pulses.  Doppler shows brisk biphasic pulses in bilateral DP/PT   Impression & Recommendations:  Problem # 1:  PVD (ICD-443.9) Vascular calcification seen on X-ray is of absolutely no clinical significance in this 77 yo.  His symptoms are clearly back and orthopedic related.  Pulses are totally normal.  Would further w/u for spinal stenosis and Rx pain with NSAI's or Tramadol  Problem # 2:  ESSENTIAL  HYPERTENSION, BENIGN (ICD-401.1) Contineu ACE in light of DM His updated medication list for this problem includes:    Adult Aspirin Low Strength 81 Mg Tbdp (Aspirin) .[name]... 3xweek    Lisinopril 20 Mg Tabs (Lisinopril) .[name]... 1 once daily for diabetic treatment  Patient Instructions: 1)  Your physician recommends that you schedule a follow-up appointment in: as needed 2)  Your physician recommends that you continue on your current medications as directed. Please refer to the Current Medication list given to you today.

## 2010-06-04 NOTE — Progress Notes (Signed)
Summary: call back  Phone Note Call from Patient   Caller: Patient Call For: [name] MD Summary of Call: [phone] Pt wants Dr. Scotty Court to call him re: his condition.  Some better but wants to speak to Dr. Scotty Court. Initial call taken by: Lynann Beaver CMA,  November 27, 2009 10:26 AM  Follow-up for Phone Call        Relates better, continue  tx Follow-up by: [name] MD,  November 30, 2009 5:21 PM

## 2010-06-04 NOTE — Progress Notes (Signed)
Summary: reck hgb a 1 c  Phone Note Call from Patient   Caller: pt walked in  Summary of Call: pt walked in requesting hgb a 1c  Initial call taken by: Pura Spice, RN,  December 25, 2009 10:11 AM  Follow-up for Phone Call        dr staffrod notified and ok.  Follow-up by: Pura Spice, RN,  December 25, 2009 10:11 AM

## 2010-06-04 NOTE — Assessment & Plan Note (Signed)
Summary: rash has not cleared//ccm   Vital Signs:  Patient profile:   77 year old male Weight:      236 pounds O2 Sat:      97 % Temp:     98.5 degrees F Pulse rate:   81 / minute Pulse rhythm:   regular BP sitting:   130 / 80  (left arm)  Vitals Entered By: Pura Spice, RN (November 19, 2009 2:30 PM) CC: rash not much improvement    History of Present Illness: This 77 year old white male diabetic was treated recently for nearly all rash over the scrotum inguinal region and perineum and had improved slightly but continued and very uncomfortable Desires reevaluation and on discussion failed cornea the patient is not tip this area dry from urine and is here taking the rash Diabetes is well controlled No other complaint  Allergies (verified): No Known Drug Allergies  Past History:  Past Medical History: Last updated: 11/01/2007   SOB (ICD-786.05) ABRASION, ARM (ICD-919.0) COMPRESSION FRACTURE, THORACIC VERTEBRA (ICD-805.2) EXOGENOUS OBESITY (ICD-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 pt has 3 children. Never Smoked Alcohol use-yes  Risk Factors: Smoking Status: never (01/25/2007)  Review of Systems      See HPI  The patient denies anorexia, fever, weight loss, weight gain, vision loss, decreased hearing, hoarseness, chest pain, syncope, dyspnea on exertion, peripheral edema, prolonged cough, headaches, hemoptysis,  abdominal pain, melena, hematochezia, severe indigestion/heartburn, hematuria, incontinence, genital sores, muscle weakness, suspicious skin lesions, transient blindness, difficulty walking, depression, unusual weight change, abnormal bleeding, enlarged lymph nodes, angioedema, breast masses, and testicular masses.    Physical Exam  General:  Well-developed,well-nourished,in no acute distress; alert,appropriate and cooperative throughout examinationoverweight-appearing.   Lungs:  Normal respiratory effort, chest expands symmetrically. Lungs are clear to auscultation, no crackles or wheezes. Heart:  Normal rate and regular rhythm. S1 and S2 normal without gallop, murmur, click, rub or other extra sounds. Extremities:  no edema Skin:  erythematous or tender rash over scrotum Minimal improvement from previously   Impression & Recommendations:  Problem # 1:  DERMATITIS, ATOPIC (ICD-691.8) Assessment New troponins on lotion 3 times daily Keep rash area dry  Problem # 2:  OBSTRUCTIVE SLEEP APNEA (ICD-327.23) Assessment: Improved  Problem # 3:  EXOGENOUS OBESITY (ICD-278.00) Assessment: Unchanged  Problem # 4:  DIABETES MELLITUS, TYPE II (ICD-250.00) Assessment: Improved  His updated medication list for this problem includes:    Adult Aspirin Low Strength 81 Mg Tbdp (Aspirin) .[name]... 3xweek    Glucotrol 10 Mg Tabs (Glipizide) .[name]... Take 1 tablet by mouth two times a day    Lisinopril 20 Mg Tabs (Lisinopril) .[name]... 1 once daily for diabetic treatment  Complete Medication List: 1)  Flomax 0.4 Mg Cp24 (Tamsulosin hcl) .... Once daily 2)  Adult Aspirin Low Strength 81 Mg Tbdp (Aspirin) .... 3xweek 3)  Glucotrol 10 Mg Tabs (Glipizide) .... Take 1 tablet by mouth two times a day 4)  Accu-chek Aviva Strp (Glucose blood) .... Bid 5)  Accu-chek Multiclix Lancets Misc (Lancets) .... Test as directed. 6)  Lisinopril 20 Mg Tabs (Lisinopril) .[name].. 1 once  daily for diabetic treatment 7)  Sertraline  Hcl 100 Mg Tabs (Sertraline hcl) .... Once daily 8)  Accu-chek Aviva Soln (Blood glucose calibration) .... Use with accu chek aviva glucometer for calibration 9)  Nabumetone 750 Mg Tabs (Nabumetone) .[name].. 1 two times a day for arthritis 10)  1 Pr Diabetic Shoes  .... Asinstructed 11)  Cortisporin 3.5-10000-1 Soln (Neomycin-polymyxin-hc) .... 4 qtts in ear three times a day for external otitis 12)  Doxycycline Hyclate 100 Mg Tabs (Doxycycline hyclate) .[name].. 1 bid 13)  Hydrocodone-homatropine 5-1.5 Mg/46ml Syrp (Hydrocodone-homatropine) .[name].. 1-2 tsp q4h as needed for cough 14)  Mucinex D [phone] Mg Xr12h-tab (Pseudoephedrine-guaifenesin) .[name].. 1 bid 15)  Vitamin D (ergocalciferol) 50000 Unit Caps (Ergocalciferol) .[name].. 1 cap weel;y for 12 weeks 16)  Lotrisone 1-0.05 % Crea (Clotrimazole-betamethasone) .... Apply to rash thinly but thoroughly for 14 days, repeat if needed 17)  Tropozone Lotion  .... Apply  tid  Patient Instructions: 1)  we will change treatment using a lotion and call tropozone 2)  Apply 3 times a day 3)  Recommend not warrant for tonight during the daytime attempt to keep this area dry from your 4)  Call in one week Prescriptions: TROPOZONE LOTION apply  tid  #120 cc x 3   Entered and Authorized by:   [name] MD   Signed by:   [name] MD on 11/30/2009   Method used:   Telephoned to ...       [facility]  [address]  [phone]* (retail)       [address]       [place], [place]  [address]       Ph: [phone] or [phone]       Fax: [phone]   RxID:   [id number]

## 2010-06-04 NOTE — Letter (Signed)
Summary: Results Follow-up Letter  [place] [facility]  [address] [place], [place] [address]   Phone: [phone]  Fax: [phone]    12/30/2009  [address] [place], [place]  [address]  Dear Mr. BASNETT,  I could not reach you via phone so I am sending this letter to inform you that your HGB A1C is 6.5 in which Dr Scotty Court said was "good" .   Sincerely,  Gina,RN [place] [facility]

## 2010-06-04 NOTE — Assessment & Plan Note (Signed)
Summary: BACK PAIN // RS   Vital Signs:  Patient profile:   77 year old male Height:      70 inches (177.80 cm) Weight:      242 pounds (110.00 kg) O2 Sat:      97 % on Room air Temp:     97.8 degrees F (36.56 degrees C) oral Pulse rate:   72 / minute BP sitting:   118 / 70  (left arm) Cuff size:   large  Vitals Entered By: Josph Macho RMA (March 04, 2010 12:24 PM)  O2 Flow:  Room air CC: back pain on lower back on right side X2 weeks/ CF Is Patient Diabetic? Yes   History of Present Illness: patient is a 77 year old Caucasian male, a known diabetic in today with pain complaints. He denies any falls or trauma. he reports low back pain radiating to the posterior right hip for the last 2 weeks. He reports the pain is worse after prolonged sitting, with position changes and twisting. Describes the pain as a constant dull ache with sharp pains on movement. Also has a long history of bilateral knee pain which is slowly worsening. He describes weakness in his legs and his knees. 2.3 his difficulty arising at times due to the weakness. Says he often feels off balance and unsteady in the recent past denies any numbness, tingling, weakness in his feet bilaterally denies recent fevers, chills, chest pain, palpitations, shortness of breath. Is concerned that his right flank pain is the result of some urinary difficulties but denies urinary frequency, urgency, dysuria  Current Medications (verified): 1)  Flomax 0.4 Mg  Cp24 (Tamsulosin Hcl) .... Once Daily 2)  Adult Aspirin Low Strength 81 Mg  Tbdp (Aspirin) .... 3xweek 3)  Glucotrol 10 Mg  Tabs (Glipizide) .... Take 1 Tablet By Mouth Two Times A Day 4)  Accu-Chek Aviva   Strp (Glucose Blood) .... Bid 5)  Accu-Chek Multiclix Lancets   Misc (Lancets) .... Test As Directed. 6)  Lisinopril 20 Mg  Tabs (Lisinopril) .[name].. 1 Once Daily For Diabetic Treatment 7)  Sertraline Hcl 100 Mg Tabs (Sertraline Hcl) .... Once Daily 8)  Accu-Chek Aviva  Soln  (Blood Glucose Calibration) .... Use With Accu Chek Aviva Glucometer For Calibration 9)  Nabumetone 750 Mg Tabs (Nabumetone) .[name].. 1 Two Times A Day For Arthritis 10)  1 Pr Diabetic Shoes .... Asinstructed 11)  Cortisporin 3.5-10000-1 Soln (Neomycin-Polymyxin-Hc) .... 4 Qtts in Ear Three Times A Day For External Otitis 12)  Doxycycline Hyclate 100 Mg Tabs (Doxycycline Hyclate) .[name].. 1 Bid 13)  Hydrocodone-Homatropine 5-1.5 Mg/62ml Syrp (Hydrocodone-Homatropine) .[name].. 1-2 Tsp Q4h As Needed For Cough 14)  Mucinex D [phone] Mg Xr12h-Tab (Pseudoephedrine-Guaifenesin) .[name].. 1 Bid 15)  Vitamin D (Ergocalciferol) 50000 Unit Caps (Ergocalciferol) .[name].. 1 Cap Weel;y For 12 Weeks 16)  Lotrisone 1-0.05 % Crea (Clotrimazole-Betamethasone) .... Apply To Rash Thinly But Thoroughly For 14 Days, Repeat If Needed 17)  Tropozone Lotion .... Apply  Tid  Allergies (verified): No Known Drug Allergies  Past History:  Past medical history reviewed for relevance to current acute and chronic problems. Social history (including risk factors) reviewed for relevance to current acute and chronic problems.  Past Medical History: Reviewed history from 11/01/2007 and no changes required.   SOB (ICD-786.05) ABRASION, ARM (ICD-919.0) COMPRESSION FRACTURE, THORACIC VERTEBRA (ICD-805.2) EXOGENOUS OBESITY (ICD-278.00) DIABETES MELLITUS, TYPE II (ICD-250.00) ANEMIA (ICD-285.9) BENIGN PROSTATIC HYPERTROPHY, HX OF (ICD-V13.8) HYPERLIPIDEMIA (ICD-272.4) HYPOTHYROIDISM (ICD-244.9) MUSCLE SPASM, LUMBAR REGION (ICD-724.8) UTI (ICD-599.0) OSTEOARTHRITIS (  ICD-715.90) TINEA CORPORIS (ICD-110.5) DEPRESSION (ICD-311) FAMILY HISTORY DIABETES 1ST DEGREE RELATIVE (ICD-V18.0)    Social History: Reviewed history from 11/01/2007 and no changes required. Retired from [employer].  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 pt has 3 children. Never Smoked Alcohol use-yes  Review of Systems      See HPI       Flu Vaccine  Consent Questions     Do you have a history of severe allergic reactions to this vaccine? no    Any prior history of allergic reactions to egg and/or gelatin? no    Do you have a sensitivity to the preservative Thimersol? no    Do you have a past history of Guillan-Barre Syndrome? no    Do you currently have an acute febrile illness? no    Have you ever had a severe reaction to latex? no    Vaccine information given and explained to patient? yes    Are you currently pregnant? no    Lot Number:[id number]   Exp Date:10/31/2010   Site Given  Left Deltoid IM Josph Macho RMA  March 04, 2010 12:26 PM   Physical Exam  General:  Well-developed,well-nourished,in no acute distress; alert,appropriate and cooperative throughout examination Head:  Normocephalic and atraumatic without obvious abnormalities. No apparent alopecia or balding. Mouth:  Oral mucosa and oropharynx without lesions or exudates.  Teeth in good repair. Neck:  No deformities, masses, or tenderness noted. Lungs:  Normal respiratory effort, chest expands symmetrically. Lungs are clear to auscultation, no crackles or wheezes. Heart:  Normal rate and regular rhythm. S1 and S2 normal without gallop, murmur, click, rub or other extra sounds. Abdomen:  Bowel sounds positive,abdomen soft and non-tender without masses, organomegaly or hernias noted. Msk:  b/l knees with joint thickening, no point tenderness, edema, erythema, or ligamentous laxity Extremities:  No clubbing, cyanosis, edema, or deformity noted Psych:  Cognition and judgment appear intact. Alert and cooperative with normal attention span and concentration. No apparent delusions, illusions, hallucinations   Impression & Recommendations:  Problem # 1:  ARTHRITIS, KNEES, BILATERAL (ICD-716.98)  Orders: Physical Therapy Referral (PT) Occupational Therapy (OT) T-Knee Comp Right 4 Views (16109UE) T-Knee Comp Left 4 Views [phone]) May use his Nabumetone and Tylenol  two times a day and reassess after therapy  Problem # 2:  FLANK PAIN, RIGHT (ICD-789.09)  His updated medication list for this problem includes:    Adult Aspirin Low Strength 81 Mg Tbdp (Aspirin) .[name]... 3xweek    Nabumetone 750 Mg Tabs (Nabumetone) .[name]... 1 two times a day for arthritis  Orders: UA Dipstick w/o Micro (automated)  (81003) Specimen Handling (19147) Venipuncture (82956) TLB-Renal Function Panel (80069-RENAL) TLB-CBC Platelet - w/Differential (85025-CBCD) TLB-Hepatic/Liver Function Pnl (80076-HEPATIC) TLB-TSH (Thyroid Stimulating Hormone) (84443-TSH)  Problem # 3:  DIABETES MELLITUS, TYPE II (ICD-250.00)  His updated medication list for this problem includes:    Adult Aspirin Low Strength 81 Mg Tbdp (Aspirin) .[name]... 3xweek    Glucotrol 10 Mg Tabs (Glipizide) .[name]... Take 1 tablet by mouth two times a day    Lisinopril 20 Mg Tabs (Lisinopril) .[name]... 1 once daily for diabetic treatment Reports sugars ranging from 90 t0 150 recently  Complete Medication List: 1)  Flomax 0.4 Mg Cp24 (Tamsulosin hcl) .... Once daily 2)  Adult Aspirin Low Strength 81 Mg Tbdp (Aspirin) .... 3xweek 3)  Glucotrol 10 Mg Tabs (Glipizide) .... Take 1 tablet by mouth two times a day 4)  Accu-chek Aviva Strp (Glucose blood) .Marland KitchenMarland KitchenMarland Kitchen  Bid 5)  Accu-chek Multiclix Lancets Misc (Lancets) .... Test as directed. 6)  Lisinopril 20 Mg Tabs (Lisinopril) .[name].. 1 once daily for diabetic treatment 7)  Sertraline Hcl 100 Mg Tabs (Sertraline hcl) .... Once daily 8)  Accu-chek Aviva Soln (Blood glucose calibration) .... Use with accu chek aviva glucometer for calibration 9)  Nabumetone 750 Mg Tabs (Nabumetone) .[name].. 1 two times a day for arthritis 10)  1 Pr Diabetic Shoes  .... Asinstructed 11)  Cortisporin 3.5-10000-1 Soln (Neomycin-polymyxin-hc) .... 4 qtts in ear three times a day for external otitis 12)  Doxycycline Hyclate 100 Mg Tabs (Doxycycline hyclate) .[name].. 1 bid 13)  Hydrocodone-homatropine 5-1.5 Mg/14ml Syrp  (Hydrocodone-homatropine) .[name].. 1-2 tsp q4h as needed for cough 14)  Mucinex D [phone] Mg Xr12h-tab (Pseudoephedrine-guaifenesin) .[name].. 1 bid 15)  Vitamin D (ergocalciferol) 50000 Unit Caps (Ergocalciferol) .[name].. 1 cap weel;y for 12 weeks 16)  Lotrisone 1-0.05 % Crea (Clotrimazole-betamethasone) .... Apply to rash thinly but thoroughly for 14 days, repeat if needed 17)  Tropozone Lotion  .... Apply  tid  Other Orders: Flu Vaccine 55yrs + MEDICARE PATIENTS (J8119) Administration Flu vaccine - MCR (G0008) T-Bilateral Hip w/Pelvis, min 2 views (73520TC)  Patient Instructions: 1)  Please schedule a follow-up appointment in 1 month or sooner if symptoms worsen. 2)  Minimize carbs,  3)  Take Nabumetone two times a day for the next week 4)  Take 650 - 1000 mg of tylenol every 4-6 hours as needed for relief of pain or comfort of fever. Avoid taking more than 3000 mg in a 24 hour period( can cause liver damage in higher doses).  5)  Try Tylenol 100mg  at least 2 x daily for the next week    Orders Added: 1)  Flu Vaccine 85yrs + MEDICARE PATIENTS [Q2039] 2)  Administration Flu vaccine - MCR [G0008] 3)  UA Dipstick w/o Micro (automated)  [81003] 4)  Specimen Handling [99000] 5)  Venipuncture [36415] 6)  Physical Therapy Referral [PT] 7)  Occupational Therapy [OT] 8)  TLB-Renal Function Panel [80069-RENAL] 9)  TLB-CBC Platelet - w/Differential [85025-CBCD] 10)  TLB-Hepatic/Liver Function Pnl [80076-HEPATIC] 11)  TLB-TSH (Thyroid Stimulating Hormone) [84443-TSH] 12)  T-Bilateral Hip w/Pelvis, min 2 views [73520TC] 13)  T-Knee Comp Right 4 Views [73564TC] 14)  T-Knee Comp Left 4 Views [73564TC] 15)  Est. Patient Level IV [14782]    Laboratory Results   Urine Tests    Routine Urinalysis   Color: yellow Appearance: Clear Glucose: trace   (Normal Range: Negative) Bilirubin: negative   (Normal Range: Negative) Ketone: negative   (Normal Range: Negative) Spec. Gravity: 1.020   (Normal  Range: 1.003-1.035) Blood: negative   (Normal Range: Negative) pH: 5.5   (Normal Range: 5.0-8.0) Protein: negative   (Normal Range: Negative) Urobilinogen: 0.2   (Normal Range: 0-1) Nitrite: negative   (Normal Range: Negative) Leukocyte Esterace: negative   (Normal Range: Negative)

## 2010-06-10 ENCOUNTER — Other Ambulatory Visit: Payer: Self-pay | Admitting: Dermatology

## 2010-06-16 ENCOUNTER — Encounter: Payer: Self-pay | Admitting: Internal Medicine

## 2010-06-16 ENCOUNTER — Ambulatory Visit (INDEPENDENT_AMBULATORY_CARE_PROVIDER_SITE_OTHER): Payer: PRIVATE HEALTH INSURANCE | Admitting: Internal Medicine

## 2010-06-16 DIAGNOSIS — H6092 Unspecified otitis externa, left ear: Secondary | ICD-10-CM | POA: Insufficient documentation

## 2010-06-16 DIAGNOSIS — E119 Type 2 diabetes mellitus without complications: Secondary | ICD-10-CM

## 2010-06-16 DIAGNOSIS — H60399 Other infective otitis externa, unspecified ear: Secondary | ICD-10-CM

## 2010-06-16 MED ORDER — AMOXICILLIN 875 MG PO TABS: 875.0000 mg | ORAL_TABLET | Freq: Two times a day (BID) | ORAL | Status: AC

## 2010-06-16 MED ORDER — NEOMYCIN-POLYMYXIN-HC 3.5-10000-1 OT SOLN: 3.0000 [drp] | Freq: Four times a day (QID) | OTIC | Status: AC

## 2010-06-16 NOTE — Assessment & Plan Note (Signed)
Begin abx gtts and po abx course. Followup closely if no improvement or worsening.

## 2010-06-16 NOTE — Progress Notes (Signed)
  Subjective:    Patient ID: Jon Fields, male    DOB: [birth year], 77 y.o.   MRN: [id number]  HPI Pt presents to clinic for evaluation of left ear pain. Notes 4d h/o left ear pain and surrounding face/head pain. No drainage from ear, fever or chills. +some nasal drainage and congestion. Has attempted unspecified abx gtts to ear. H/o DM with last A1c 6.6% late November. States typical control of fsbs low 100's however last 2 months 135-140. Most recently over last 1.5wks attempted improved diet and now fsbs ~110. No hypoglycemia.  Reviewed PMH, medications and allergies    Review of Systems  Constitutional: Negative for fever and chills.  HENT: Positive for ear pain, congestion, rhinorrhea and neck pain. Negative for facial swelling and ear discharge.   Eyes: Negative for discharge and redness.       Objective:   Physical Exam  Constitutional: He appears well-developed and well-nourished. No distress.  HENT:  Head: Normocephalic and atraumatic.  Right Ear: Tympanic membrane, external ear and ear canal normal.  Left Ear: No lacerations. There is drainage, swelling and tenderness. No foreign bodies.  Nose: Nose normal.  Mouth/Throat: Oropharynx is clear and moist. No oropharyngeal exudate.       +canal swelling and exudate.   Eyes: Conjunctivae are normal. No scleral icterus.  Skin: He is not diaphoretic.          Assessment & Plan:

## 2010-06-16 NOTE — Assessment & Plan Note (Signed)
Recent improved control. Schedule followup with PMD. Monitor fsbs closely during infxn for hyperglycemia or hypoglycemia.

## 2010-07-16 ENCOUNTER — Ambulatory Visit: Payer: PRIVATE HEALTH INSURANCE | Admitting: Family Medicine

## 2010-07-16 ENCOUNTER — Encounter: Payer: Self-pay | Admitting: Family Medicine

## 2010-07-16 ENCOUNTER — Ambulatory Visit (INDEPENDENT_AMBULATORY_CARE_PROVIDER_SITE_OTHER): Payer: PRIVATE HEALTH INSURANCE | Admitting: Family Medicine

## 2010-07-16 VITALS — BP 130/70 | HR 82 | Temp 97.7°F | Ht 71.0 in | Wt 233.0 lb

## 2010-07-16 DIAGNOSIS — E119 Type 2 diabetes mellitus without complications: Secondary | ICD-10-CM

## 2010-07-16 DIAGNOSIS — R339 Retention of urine, unspecified: Secondary | ICD-10-CM

## 2010-07-16 DIAGNOSIS — R0989 Other specified symptoms and signs involving the circulatory and respiratory systems: Secondary | ICD-10-CM

## 2010-07-16 DIAGNOSIS — H60399 Other infective otitis externa, unspecified ear: Secondary | ICD-10-CM

## 2010-07-16 DIAGNOSIS — H6090 Unspecified otitis externa, unspecified ear: Secondary | ICD-10-CM

## 2010-07-16 DIAGNOSIS — E669 Obesity, unspecified: Secondary | ICD-10-CM

## 2010-07-16 DIAGNOSIS — N400 Enlarged prostate without lower urinary tract symptoms: Secondary | ICD-10-CM

## 2010-07-16 DIAGNOSIS — E6609 Other obesity due to excess calories: Secondary | ICD-10-CM

## 2010-07-16 LAB — BASIC METABOLIC PANEL
CO2: 24 mEq/L (ref 19–32)
Calcium: 8.8 mg/dL (ref 8.4–10.5)
Creatinine, Ser: 1.2 mg/dL (ref 0.4–1.5)

## 2010-07-16 LAB — POCT HEMOGLOBIN: Hemoglobin: 13.6

## 2010-07-16 NOTE — Patient Instructions (Addendum)
There is a film over portion of tympainic membrane, to buy Debrox , put drops in left and rt ears  Soak for short time then irrgate with warm water each day ,if left ear does not clear then call for a new ear drop. I will check on Dr. Darcey Nora as to whether he will see you about your prostate problem then we will schedule with Dr. Logan Bores if Dr. Earlene Plater cannot see you.Will call results of lab tests 2 evaluate cardiac status and shortness of breath on exertion have scheduled nuclear stress test

## 2010-07-22 ENCOUNTER — Telehealth: Payer: Self-pay | Admitting: *Deleted

## 2010-07-22 NOTE — Telephone Encounter (Signed)
Wants to talk to Dr. Scotty Court about possible referrals to different [facility] they discussed.

## 2010-07-22 NOTE — Telephone Encounter (Signed)
Waiting to here back from Dr. Scotty Court about a possible referral to either Dr. Logan Bores or Dr. Earlene Plater

## 2010-07-30 ENCOUNTER — Ambulatory Visit (HOSPITAL_COMMUNITY): Payer: Medicare Other | Attending: Family Medicine | Admitting: Radiology

## 2010-07-30 VITALS — Ht 70.0 in | Wt 235.0 lb

## 2010-07-30 DIAGNOSIS — R0989 Other specified symptoms and signs involving the circulatory and respiratory systems: Secondary | ICD-10-CM | POA: Insufficient documentation

## 2010-07-30 DIAGNOSIS — E119 Type 2 diabetes mellitus without complications: Secondary | ICD-10-CM

## 2010-07-30 DIAGNOSIS — R0609 Other forms of dyspnea: Secondary | ICD-10-CM | POA: Insufficient documentation

## 2010-07-30 DIAGNOSIS — R0600 Dyspnea, unspecified: Secondary | ICD-10-CM

## 2010-07-30 DIAGNOSIS — R0602 Shortness of breath: Secondary | ICD-10-CM

## 2010-07-30 MED ORDER — TECHNETIUM TC 99M TETROFOSMIN IV KIT
11.0000 | PACK | Freq: Once | INTRAVENOUS | Status: AC | PRN
  Administered 2010-07-30: 11 via INTRAVENOUS

## 2010-07-30 MED ORDER — REGADENOSON 0.4 MG/5ML IV SOLN: 0.4000 mg | Freq: Once | INTRAVENOUS | Status: DC

## 2010-07-30 MED ORDER — TECHNETIUM TC 99M TETROFOSMIN IV KIT
33.0000 | PACK | Freq: Once | INTRAVENOUS | Status: AC | PRN
  Administered 2010-07-30: 33 via INTRAVENOUS

## 2010-07-30 NOTE — Progress Notes (Signed)
[facility] SITE 3 NUCLEAR MED [address] [place] [address] [phone]  Cardiology Nuclear Med Study Jon Fields male 05/31/1926   Nuclear Med Background Indication for Stress Test:  Evaluation for Ischemia History: '96 Heart Catheterization no sig. CAD EF 78% and Myocardial Perfusion Study EF 60% mild ischemia laterally Cardiac Risk Factors: Family History - CAD, Hypertension, Lipids, NIDDM and PVD  Symptoms:  DOE, Fatigue and SOB   Nuclear Pre-Procedure Caffeine/Decaff Intake:  7:00pm NPO After: 8:00am   Lungs:  clear IV 0.9% NS with Angio Cath:  22g  IV Site: R Wrist  IV Started by:  Stanton Kidney, EMT-P  Chest Size (in):  42 Cup Size:   NA  Height: 5\' 10"  (1.778 m)  Weight:  235 lb (106.595 kg)  BMI:  Body mass index is 33.72 kg/(m^2). Tech Comments:  CBG= 84 @ 7:30 am, per patient    Nuclear Med Study 1 or 2 day study: 1 day  Stress Test Type:  Eugenie Birks  Reading MD: Dietrich Pates, MD  Order Authorizing Provider:  W.Stafford  Resting Radionuclide: Technetium 49m Tetrofosmin  Resting Radionuclide Dose: 11.0 mCi   Stress Radionuclide:  Technetium 73m Tetrofosmin  Stress Radionuclide Dose: 33.0 mCi           Stress Protocol Rest HR: 55 Stress HR: 75  Rest BP: 141/69 Stress BP: 132/48  Exercise Time: N/A METS: N/A  Predicted HR: N/A % of Maximum: N/A        Dose of Adenosine:  N/A Dose of Lexiscan: 0.4 mg  Dose of Atropine: N/A Dose of Dobutamine: N/A  Stress Test Technologist: Milana Na, EMT-P  Nuclear Technologist:  Domenic Polite, CNMT     Rest Procedure:  Myocardial perfusion imaging was performed at rest 45 minutes following the intravenous administration of Technetium 43m Tetrofosmin. Rest ECG: Sinus Bradycardia  Stress Procedure:  The patient received IV Lexiscan 0.4 mg over 15-seconds.  Technetium 56m Tetrofosmin injected at 30-seconds.  There were no significant changes with Lexiscan.  Quantitative spect images were  obtained after a 45 minute delay. Stress ECG: No significant change from baseline ECG  QPS Raw Data Images:  Normal; no motion artifact; normal heart/lung ratio. Stress Images:  Normal homogeneous uptake in all areas of the myocardium. Rest Images:  Normal homogeneous uptake in all areas of the myocardium. Subtraction (SDS):  No evidence of ischemia.  Findings Risk Category:  Normal nuclear study. Clinically Abnormal:  No Ischemia:  No Fixed Defect:  No LV Dysfunction:  No Transient Ischemic Dilatation (Normal <1.22):  1.06 Lung/Heart Ratio (Normal <0.45): .41  Quantitative Gated Spect Images QGS EDV: 86 ml QGS ESV: 40 ml QGS cine images:  Normal wall motion. QGS EF:  54 %  Impression Exercise Capacity:  Lexiscan with no exercise. BP Response:  Normal blood pressure response. Clinical Symptoms:  No chest pain. ECG Impression:  No significant ST segment change suggestive of ischemia. Comparison with Prior Nuclear Study: Since previous report of 2009 no significant change.  Overall Impression:  Normal stress nuclear study.

## 2010-07-30 NOTE — Telephone Encounter (Signed)
Dr. Earlene Plater does not come to [facility], cotinue to see local urologist

## 2010-08-17 ENCOUNTER — Telehealth: Payer: Self-pay | Admitting: *Deleted

## 2010-08-17 NOTE — Telephone Encounter (Signed)
Needs stress test results, please.

## 2010-08-25 ENCOUNTER — Telehealth: Payer: Self-pay

## 2010-08-25 NOTE — Telephone Encounter (Signed)
Form faxed to [employer] for a device

## 2010-08-26 NOTE — Telephone Encounter (Signed)
Pt is calling back again for stress test results.

## 2010-08-26 NOTE — Progress Notes (Signed)
  Subjective:    Patient ID: Jon Fields, male    DOB: Oct 06, 1926, 77 y.o.   MRN: [id number] This 77 year old white widower in complaining of shortness of breath on exertion no chest pain does feel like he has tachycardia at times. Walking causes his dyspnea but he has difficulty walking or far due to his pain in his hip therefore, able to do a treadmill stress test here Continues to have sensation of something in his ear he was seen by Dr. Irving Burton in early her. He also relates his on the care of Dr. Patsi Sears for UTI and also difficult urination and does have a urodynamic test in the near future. As far as his hip pain he stop Relafen and started naproxen 500 mg b.i.d. He continues to have arthritic pain in his other joints as well as hip he relates his CBGs are 90-120 He relates he should continue the Zoloft since he continues to feel depressed at times her he does have a lady friend please him very much He has no other complaints blood sugars under good control He continues to have back pain from the compression fracture previously over the thoracic vertebra HPI    Review of Systems see history of present illness     Objective:   Physical Exam the patient is a well-developed well-nourished obese male weighing 233 pounds blood pressure 130/70 pO2 95% HEENT reveals a male over the tympanic membrane of the left tympanic membrane, right ear negative Lungs clear to palpation percussion and auscultation no rales no wheezing no dullness Heart no evidence of cardiomegaly pulse rate 82 no murmurs regular rhythm Abdomen obese liver spleen and kidneys nonpalpable no masses normal bowel sounds Extremities tenderness over the left hip No edema        Assessment & Plan:  Exertional dyspnea to have a nuclear stress test also lab studies will get  b met hemoglobin A1c and hemoglobin, 2 evaluate cardiac status with nuclear stress test Diabetes mellitus continue same treatment Hypertension no  change in meds Arthritis change to Naproxen 500 mg b.i.d. Urinary retention continue under the care of the urologist

## 2010-08-31 ENCOUNTER — Other Ambulatory Visit: Payer: Self-pay | Admitting: Family Medicine

## 2010-09-01 HISTORY — PX: TRANSURETHRAL RESECTION OF PROSTATE: SHX73

## 2010-09-01 NOTE — Telephone Encounter (Signed)
Called pt.

## 2010-09-04 ENCOUNTER — Other Ambulatory Visit: Payer: Self-pay

## 2010-09-04 MED ORDER — NAPROXEN 500 MG PO TABS: 500.0000 mg | ORAL_TABLET | Freq: Two times a day (BID) | ORAL | Status: DC

## 2010-09-04 NOTE — Progress Notes (Signed)
Routed to Dr. Scotty Court

## 2010-09-07 ENCOUNTER — Telehealth: Payer: Self-pay | Admitting: Family Medicine

## 2010-09-07 NOTE — Telephone Encounter (Signed)
Darlene from [facility] called requesting records. Records were faxed to [phone]. 09/07/10 BAB

## 2010-09-08 ENCOUNTER — Other Ambulatory Visit: Payer: Self-pay | Admitting: Urology

## 2010-09-08 ENCOUNTER — Encounter (HOSPITAL_COMMUNITY): Payer: Medicare Other | Attending: Urology

## 2010-09-08 DIAGNOSIS — Z01812 Encounter for preprocedural laboratory examination: Secondary | ICD-10-CM | POA: Insufficient documentation

## 2010-09-08 DIAGNOSIS — Z79899 Other long term (current) drug therapy: Secondary | ICD-10-CM | POA: Insufficient documentation

## 2010-09-08 DIAGNOSIS — E119 Type 2 diabetes mellitus without complications: Secondary | ICD-10-CM | POA: Insufficient documentation

## 2010-09-08 DIAGNOSIS — N400 Enlarged prostate without lower urinary tract symptoms: Secondary | ICD-10-CM | POA: Insufficient documentation

## 2010-09-08 DIAGNOSIS — Z7982 Long term (current) use of aspirin: Secondary | ICD-10-CM | POA: Insufficient documentation

## 2010-09-08 LAB — CBC
HCT: 37.6 % — ABNORMAL LOW (ref 39.0–52.0)
MCH: 31.4 pg (ref 26.0–34.0)
MCHC: 34.3 g/dL (ref 30.0–36.0)
RDW: 14 % (ref 11.5–15.5)

## 2010-09-08 LAB — SURGICAL PCR SCREEN
MRSA, PCR: NEGATIVE
Staphylococcus aureus: NEGATIVE

## 2010-09-08 LAB — BASIC METABOLIC PANEL
BUN: 17 mg/dL (ref 6–23)
Calcium: 9.3 mg/dL (ref 8.4–10.5)
Creatinine, Ser: 0.82 mg/dL (ref 0.4–1.5)
GFR calc non Af Amer: >60 mL/min (ref >60)
Glucose, Bld: 131 mg/dL — ABNORMAL HIGH (ref 70–99)
Sodium: 138 mEq/L (ref 135–145)

## 2010-09-14 ENCOUNTER — Ambulatory Visit (HOSPITAL_COMMUNITY)
Admission: RE | Admit: 2010-09-14 | Discharge: 2010-09-14 | Disposition: A | Discharge status: Home / Self Care | Payer: Medicare Other | Source: Ambulatory Visit | Attending: Urology | Admitting: Urology

## 2010-09-14 ENCOUNTER — Other Ambulatory Visit: Payer: Self-pay | Admitting: Urology

## 2010-09-14 DIAGNOSIS — E119 Type 2 diabetes mellitus without complications: Secondary | ICD-10-CM | POA: Insufficient documentation

## 2010-09-14 DIAGNOSIS — N401 Enlarged prostate with lower urinary tract symptoms: Secondary | ICD-10-CM | POA: Insufficient documentation

## 2010-09-14 DIAGNOSIS — N138 Other obstructive and reflux uropathy: Secondary | ICD-10-CM | POA: Insufficient documentation

## 2010-09-14 DIAGNOSIS — Z01812 Encounter for preprocedural laboratory examination: Secondary | ICD-10-CM | POA: Insufficient documentation

## 2010-09-14 LAB — GLUCOSE, CAPILLARY: Glucose-Capillary: 157 mg/dL — ABNORMAL HIGH (ref 70–99)

## 2010-09-15 NOTE — Op Note (Signed)
  NAMEDEKLEN, POPELKA              ACCOUNT NO.:  [id number]  MEDICAL RECORD NO.:  [id number]           PATIENT TYPE:  O  LOCATION:  DAYL                         FACILITY:  [facility]  PHYSICIAN:  Ramiah Helfrich I. Patsi Sears, M.D.DATE OF BIRTH:  [birth year]  DATE OF PROCEDURE:  09/14/2010 DATE OF DISCHARGE:                              OPERATIVE REPORT   PREOPERATIVE DIAGNOSIS:  Benign prostatic hypertrophy.  POSTOPERATIVE DIAGNOSIS:  Benign prostatic hypertrophy.  OPERATIONS: 1. Cystourethroscopy. 2. Holmium laser transurethral resection of prostate.  SURGEON:  Telissa Palmisano I. Patsi Sears, M.D.  ANESTHESIA:  General LMA.  PREPARATION:  After appropriate preanesthesia, the patient was brought to the operating room, placed on the operating room table in dorsal supine position where general LMA anesthesia was introduced.  He was then re-placed in dorsal low lithotomy position where the pubis was prepped with Betadine solution and draped in usual fashion.  REVIEW OF HISTORY:  Mr. Hennington is an 77 year old male with significant urinary frequency every 20 minutes, urgency.  IPSS is 14/7 despite the medications.  Note, he has a history of diabetes and depression.  The patient had cystoscopy showing BPH with a very large right lateral lobe. 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  The patient believes he will void normally after TURP and therefore refuses suprapubic tube placement at the same time of his holmium laser prostatectomy.  He does know how to self-intermittent cath and is agreeable to self-intermittent cath in the future if necessary.  DESCRIPTION OF PROCEDURE:  Cystoscopy reveals a bilobar BPH with very large right lateral lobe.  Using the holmium laser, incision was made at the 6 o'clock position and at the 10 o'clock position.  A very large right lateral lobe was dissected down on the lateral side and cut across the midline.  This removed the very large lobe  and floated freely into the bladder.  Additional tissue on the right side was taken, as well as on the left side.  However, on the left side, problem with the laser was noted, with the laser fired too close to the lens.  Therefore, I changed the scope and tried to continue the holmium laser without the bridge. However, this was not easily done, therefore I switched to standard TURP to clean up the left lateral lobe and base.  I electrocoagulated the right lateral lobe as well.  Chips were evacuated from the bladder, sent to laboratory.  Dictation ended at this point.     Kaida Games I. Patsi Sears, M.D.     SIT/MEDQ  D:  09/14/2010  T:  09/14/2010  Job:  [id number]  Electronically Signed by Jethro Bolus M.D. on 09/15/2010 05:10:53 PM

## 2010-09-15 NOTE — Op Note (Signed)
  NAMEKRISTOFF, Jon Fields              ACCOUNT NO.:  [id number]  MEDICAL RECORD NO.:  [id number]           PATIENT TYPE:  O  LOCATION:  DAYL                         FACILITY:  [facility]  PHYSICIAN:  Chandlar Guice I. Patsi Sears, M.D.DATE OF BIRTH:  May 25, 1926  DATE OF PROCEDURE: DATE OF DISCHARGE:                              OPERATIVE REPORT   PREOPERATIVE DIAGNOSIS:  Benign prostatic hypertrophy.  POSTOPERATIVE DIAGNOSIS:  Benign prostatic hypertrophy.  OPERATION:  Cystourethroscopy, holmium laser transurethral resection of the prostate.  SURGEON:  Kosisochukwu Burningham I. Patsi Sears, M.D.  ANESTHESIA:  General LMA.  PREPARATION:  After appropriate preanesthesia, the patient was brought to the operating room and placed upon the operating room table in the dorsal supine position where general LMA anesthesia was induced.  He was then replaced in the dorsal lithotomy position where the pubis was prepped with Betadine solution and draped in the usual fashion.  REVIEW OF HISTORY:  This 77 year old male has a history of BPH and significant bladder outlet obstruction despite medical therapy. Urodynamics shows an 1800 cc adynamic bladder.  The patient was offered suprapubic tube or TURP with suprapubic tube, but desires to have TURP without the suprapubic tube.  He is willing to self-catheterize if necessary in the future, but believes his bladder will return to normal. He is for holmium laser prostatectomy.  PROCEDURE IN DETAIL:  Cystoscopy reveals bilobar BPH, with the right lateral lobe much larger than the left.  An incision was made at the 6 o'clock position, and at the 10 o'clock position.  The right lateral lobe was dissected off with the holmium laser without difficulty.  This lobe was floated into the bladder.  It was cut into two pieces prior to floating it into the bladder.  The bladder neck was incised on the left side at the 1 o'clock position and the left lateral lobe was dissected down to  the 6 o'clock position.  After this was accomplished, the laser was fired too close to the lens, and I needed to take the laser bridge and the lens out, and replace them with the standard TURP equipment.  I then cauterized and cleaned up the left lateral prostatic bed and the prostatic base.  I evacuated the chips free from the bladder, and placed a guidewire, and an Ainsworth catheter.  No traction was needed.  Clear efflux was noted from the bladder.  The patient tolerated the procedure well.  He was awakened and taken to the recovery room after Toradol was given.     Deloyce Walthers I. Patsi Sears, M.D.     SIT/MEDQ  D:  09/14/2010  T:  09/14/2010  Job:  [id number]  Electronically Signed by Jethro Bolus M.D. on 09/15/2010 05:10:51 PM

## 2010-09-15 NOTE — Procedures (Signed)
NAME:  SCHUYLER, OLDEN NO.:  [id number]   MEDICAL RECORD NO.:  [id number]          PATIENT TYPE:  OUT   LOCATION:  SLEEP CENTER                 FACILITY:  [facility]   PHYSICIAN:  Barbaraann Share, MD,FCCPDATE OF BIRTH:  19[birth year]04-[birth year]   DATE OF STUDY:  12/27/2007                            NOCTURNAL POLYSOMNOGRAM   REFERRING PHYSICIAN:  Barbaraann Share, MD,FCCP   INDICATION FOR STUDY:  Hypersomnia with sleep apnea.   EPWORTH SLEEPINESS SCORE:  Nine.   SLEEP ARCHITECTURE:  The patient had a total sleep time of 285 minutes  with no slow-wave sleep and only 31 minutes of REM.  Sleep onset latency  was mildly prolonged at 35 minutes, and REM onset was very prolonged at  275 minutes.  Sleep efficiency was moderately decreased at 79%.   RESPIRATORY DATA:  The patient was found to have 5 apneas and 49  hypopneas for an AHI of 11 events per hour.  The events were much more  severe and frequent in supine REM, and there was moderate to loud  snoring noted throughout.  There were also many occasions of significant  airflow reduction, however, they did not totally meet scoring criteria  for an obstructive apnea or hypopnea.   OXYGEN DATA:  There was O2 desaturation as low as 71% with the patient's  obstructive events.  The patient spent approximately 18 minutes of  entire night less than 88% saturation.   CARDIAC DATA:  Rare PAC and PVC, with no clinically significant  arrhythmias noted.   MOVEMENT/PARASOMNIA:  The patient was found to have 598 leg jerks, with  14 per hour resulting in arousal or awakening.  There were no abnormal  behaviors noted.   IMPRESSION/RECOMMENDATION:  1. Mild obstructive sleep apnea/hypopnea syndrome with an      apnea/hypopnea index of 11 events per hour and O2 desaturation as      low as 71%.  Treatment for this degree of sleep apnea can include      weight loss alone if applicable, upper airway surgery, oral      appliance, and also continuous  positive airway pressure (CPAP).  2. Rare PAC and PVC with no clinically significant arrhythmias noted.  3. Very large numbers of leg jerks with significant sleep disruption.      Clinical correlation is suggested to rule out a primary movement      disorder of sleep.      Barbaraann Share, MD,FCCP  Diplomate, American Board of Sleep  Medicine  Electronically Signed     KMC/MEDQ  D:  01/21/2008 11:56:00  T:  01/21/2008 14:59:46  Job:  [id number]

## 2010-09-18 NOTE — Consult Note (Signed)
Jon Fields, Jon Fields NO.:  [id number]   MEDICAL RECORD NO.:  [id number]          PATIENT TYPE:  EMS   LOCATION:  MAJO                         FACILITY:  [facility]   PHYSICIAN:  Madelynn Done, MD  DATE OF BIRTH:  04/29/1927   DATE OF CONSULTATION:  08/28/2006  DATE OF DISCHARGE:                                 CONSULTATION   REASON FOR CONSULTATION:  Table saw injury, left hand.   REQUESTING PHYSICIAN:  Devoria Albe, M.D.,  Emergency Department.   HISTORY OF PRESENT ILLNESS:  Jon Fields is a 77 year old gentleman who  was working at home with his table saw when he stuck his hand into or  near the table saw and sustained injuries to his nondominant left middle  finger, index finger, ring, and thumb.  The patient's injury occurred  around 1:00 p.m..  The patient was seen and evaluated in the emergency  department; and I was consulted for the management and treatment of his  left hand.  The patient denies any previous injury to his digits.  He  received antibiotics in the emergency department as well as a tetanus  administration.  His last meal was around 12:00 p.m.   PAST MEDICAL HISTORY:  Prostate hypertrophy and hypertension.   PAST SURGICAL HISTORY:  None.   MEDICATIONS:  High blood pressure medicine as well as prostate  medication.   ALLERGIES:  No known drug allergies.   SOCIAL HISTORY:  He is retired.  He is married.  He has one daughter.  They live in [place].  He is a nonsmoker, retired from the [employer],  [place] [employer] veteran.   REVIEW OF SYSTEMS:  No recent illnesses or hospitalization.   PHYSICAL EXAMINATION:  GENERAL:  On examination he is a healthy-  appearing white male in no acute distress.  VITAL SIGNS:  He is afebrile.  His vital signs are stable and normal.  MENTAL STATUS:  He is oriented to person, place and time; in no acute  distress.  FOCUSED EXAMINATION:  On examination of the left hand,  He had a small  laceration over the distal  pad of the left thumb.  It is superficial.  On the ring finger he has a small laceration along the ulnar border of  the digit.  It is superficial and there does not appear to be  involvement of the nailbed.  On the left index finger the nail plate has  been partially avulsed off.  He has what appears to be a nailbed injury.  He has a small laceration on the volar aspect of the index finger.  It  is partial thickness through the epidermis.  He is able to flex the DIP  joint of the index finger.  He is able to extend the DIP joint of the  index finger.  His fingertip is well-perfused.  Sensation to light touch  is present distally.   On exam of the left middle finger, he has a longitudinal splint within  the distal phalanx extending into the extensor mechanism of the middle  finger.  There is exposed extensor tendon  as well as exposed bone, as  well as loss of portions of the distal phalanx and soft tissues.  He is  able to flex the DIP joint.  He is able to extend the DIP joint, but  there is a slight droop and slight ulnar deviation of the digit.  The  fingertip and skin flaps appear well-perfused.  He has no injury to the  small finger.   RADIOGRAPHS:  AP, lateral, and oblique films of the left hand do show a  comminuted fracture of the left middle and distal phalanx with bone and  soft tissue loss.  There is an oblique fracture of the left index  finger, distal phalanx; and with a little comminution.   IMPRESSION:  1. Left middle finger open distal phalanx fracture with distal tip      injury and soft tissue injury.  2. Left index finger distal phalanx fracture with a nail bed injury.  3. Left thumb lacerations, simple  4. Left ring finger laceration, simple.   PROCEDURAL NOTE:  The patient's treatment options were discussed with  him.  We talked about performing the procedure in the emergency  department.  We talked about cleaning out the open injuries to the  middle finger as  well as repair of the underlying nail bed to the index  finger.  The patient voiced understanding of the plan. The index finger  and long finger were then anesthetized with 1% lidocaine, 10 mL.  The  patient tolerated the local anesthetic.  The wounds were then irrigated  with saline solution and Betadine; in a pulsatile-type fashion.   After thorough irrigation of the wounds attention was turned to the  index finger where a small finger tourniquet was then applied; and the  remaining portion of the nail plate was then removed.  The underlying  distal phalanx fracture was exposed and thoroughly irrigated with  debridement of the bone.  A small portion of the bone was then carried  out.  The overlying nailbed was then repaired with three 6-0 chromic  sutures to the oblique laceration of the nailbed.  The nail plate was  then placed back underneath the eponychial fold to serve as a splint to  the nailbed repair.  The finger tourniquet was then removed.   Attention was then turned to the left middle finger where the wounds  were irrigated once again.  Debridement of the skin, subcutaneous tissue  and bone was then carried out over the open distal phalanx fracture.  The patient was missing a large portion of the radial part of the distal  phalanx.  Most of the extensor mechanism was in continuity.  This was  tacked down to the soft tissues with two 4-0 Vicryl sutures.  After  repair of the extensor mechanism, the skin flaps were then mobilized  both radially and ulnarly to cover the exposed distal phalanx.   After mobilization of the skin flaps, the skin was then closed with a 5-  0 Prolene suture.  The patient only had about 1/4 of the underlying nail  bed.  We talked about leaving the nailbed or completing the nailbed  excision -- the patient wanted to leave portions of the nail to leave him somewhat of an overlying nail.  It was discussed with him that we  may need to go back in at some  point and remove what little portion of  the nail remains.  After closing the skin with 5-0 Prolene, there was  good soft tissue coverage over the bone with no exposed distal phalanx.  The finger was held nice, and kept in the full extension.   Xeroform dressings were then applied to all of the fingers, though it  was not felt that it was necessary to close the laceration on the index  finger, thumb, or ring finger.  Sterile dressings were then applied.  Finger splints were applied to the index and long finger.  The patient  tolerated this well.  The finger tourniquet was removed.  The patient  tolerated the procedure well.   PLAN:  The patient is going to be discharged to home.  He will be  followed up in the office in 1 week for wound check.  He is going to be  discharged on p.o. antibiotics; and pain medication.  He is instructed  to keep his bandage clean and dry.  He needs to come back in the clinic  sooner if he has any worsening pain or problems.      Madelynn Done, MD  Electronically Signed     FWO/MEDQ  D:  08/29/2006  T:  08/29/2006  Job:  [id number]

## 2010-09-18 NOTE — H&P (Signed)
[name], [name] NO.:  [id number]   MEDICAL RECORD NO.:  [id number]          PATIENT TYPE:  EMS   LOCATION:  MAJO                         FACILITY:  [facility]   PHYSICIAN:  Adolph Pollack, M.D.DATE OF BIRTH:  20-Jun-1926   DATE OF ADMISSION:  10/21/2005  DATE OF DISCHARGE:                                HISTORY & PHYSICAL   ADMITTING PHYSICIAN:  Adolph Pollack, M.D.   CHIEF COMPLAINT:  Acute cholecystitis.   HISTORY OF PRESENT ILLNESS:  [name] is a 77 year old male patient who  reported to the ER because of abdominal pain since Monday.  In discussing  with the patient, he actually had upper chest pain starting on Monday,  intermittent in nature.  He returned again on Tuesday but now was more in  the epigastric region, again intermittent in nature but he was concerned, so  he called his primary care physician who told the patient to report to the  ER.  He was concerned about possible cardiac etiology to the pain. The pain  resolved so the patient did not report to the ER.  He did okay on Wednesday,  only troubled by some mild diarrhea.  Early about 4 a.m. this morning on  Thursday, patient was awakened from sleep with severe constant epigastric  and right upper quadrant pain as well as severe pain in the bilateral  posterior shoulders and upper back.  He presented to the ER.  Work-up was  completed.  A white count was 15,500 and ultrasound was positive for  gallstones, mildly elevated ALT at 77 and probable acute cholecystitis.  Surgical consultation has bee has been requested by the ER physician.   REVIEW OF SYSTEMS:  Patient reports anorexia, no appetite since Monday.  He  has not had any nausea or vomiting.  He did have diarrhea on Wednesday.  He  had some shortness of breath last night when he was up, trying to walk  around and do some things.  Otherwise, he does not have problems with  exertional chest pain or shortness of breath.  Otherwise,  review of systems  is negative.   ALLERGIES:  NO KNOWN DRUG ALLERGIES.   CURRENT MEDICATIONS:  Flomax, Naprosyn, Seroquel and an unknown medication.  Family is to bring in the medications to clarify names and dosages.   PAST MEDICAL HISTORY:  1.  BPH.  2.  Osteoarthritis.  3.  Depression.   PAST SURGICAL HISTORY:  1.  TURP.  2.  Appendectomy.   FAMILY HISTORY:  Noncontributory.   SOCIAL HISTORY:  No tobacco, no alcohol.  He is married.   PHYSICAL EXAMINATION:  GENERAL APPEARANCE:  A pleasant male in obvious acute  distress with abdominal pain.  VITAL SIGNS:  Temperature 98.4, blood pressure 122/67, pulse 76,  respirations 20.  HEENT:  Head is normocephalic.  Sclerae not injected.  NECK:  Supple.  No adenopathy.  CHEST:  Bilateral lung sounds are clear to auscultation.  Respiratory effort  is nonlabored.  CARDIOVASCULAR:  S1 and S2, no obvious rubs, murmurs, thrills, no gallops.  No JVD.  ABDOMEN:  Obese,  soft, nondistended, active bowel sounds.  He does have a  positive Murphy's sign and he is also tender over the epigastric region.  EXTREMITIES:  Symmetrical in appearance without clubbing, cyanosis, or  edema.  Pulses are palpable with 2+ radial, femoral and pedal.  NEUROLOGIC:  Patient is alert and oriented x3, moving all extremities x4.  No focal neurological deficits.  Cranial nerves II-XII grossly intact.   EKG has been done and shows sinus rhythm without acute ischemic changes.   LABORATORY DATA:  Sodium 137, potassium 3.7, CO2 26, BUN 20, creatinine 1.  Point of care enzymes negative x2.  White count 15,000, hemoglobin 15.1,  platelets 176,000; neutrophils 97%.  AST 37, ALT 77, total bilirubin 1.2,  lipase 19.   Ultrasound of the abdomen demonstrates multiple gallstones and mild  gallbladder wall thickening consistent with acute cholecystitis.   IMPRESSION:  1.  Acute cholecystitis.  2.  Known osteoarthritis.  3.  Depression.  4.  Benign prostatic  hypertrophy.   PLAN:  Admit to [facility], NPO in anticipation of urgent surgery today for  laparoscopic cholecystectomy with possible open procedure, possible  intraoperative cholangiogram.  Risks and benefits of this procedure have  been discussed with the patient including, but not limited to, risk for  infection, bleeding, damage to bile ducts or bowel, bile leak,  and risks  related to general anesthesia and endotracheal intubation.  Patient and wife  verbalized understanding.  It also has been discussed with patient that  pending findings with laparoscopic examination, procedure may need to change  to open procedure to facilitate complete cholecystectomy.  They also  verbalize understanding of this.  Next, in addition, we will give Dilaudid  for pain, Zofran for nausea and start Unasyn IV for empiric abdominal  pathogen coverage for acute cholecystitis.      Allison L. Rennis Harding, N.P.      Adolph Pollack, M.D.  Electronically Signed    ALE/MEDQ  D:  10/21/2005  T:  10/21/2005  Job:  [id number]

## 2010-09-18 NOTE — Discharge Summary (Signed)
NAME:  Jon Fields, LUSTY NO.:  [id number]   MEDICAL RECORD NO.:  [id number]          PATIENT TYPE:  INP   LOCATION:  5738                         FACILITY:  [facility]   PHYSICIAN:  Leonie Man, M.D.   DATE OF BIRTH:  1927-01-08   DATE OF ADMISSION:  10/21/2005  DATE OF DISCHARGE:  10/30/2005                                 DISCHARGE SUMMARY   CHIEF COMPLAINT AND REASON FOR ADMISSION:  Jon Fields is a 77 year old male  patient who presented to the ER because of abdominal pain that began on  Monday, located in the upper chest.  This pain was intermittent in nature  and returned again on Tuesday but was more focused in the epigastric region,  remained intermittent, but because of his symptoms he told his primary care  physician and was instructed to come to the ER.  The primary physician was  more concerned about possible cardiac etiology to the pain given the  patient's advanced age.  The pain resolved later that afternoon and the  patient did not report to the ER.  He was fine on Wednesday, except for some  mild diarrhea, but on Thursday he was awakened from sleep with severe  constant and epigastric and right upper quadrant pain as well as severe pain  in the bilateral posterior shoulders and upper back.  He finally came to the  ER because the pain was so severe and he was found to have a white count of  15,500.  Ultrasound demonstrated gallstones.  He was found to have a mild  elevated ALT of 77 and other findings were consistent with a picture of  acute cholecystitis.  Surgical consultant was requested by the ER physician.   EXAM:  The patient's vital signs were stable.  He was afebrile.  His abdomen  was obese, soft, nondistended with active bowel sounds.  He was definitely  tender over the right upper quadrant consistent with a Murphy's sign.  He  was also tender over the epigastric region.  Neutrophil count was 97%,  lipase was 19.  Again, the ultrasound showed  multiple gallstones, mild  gallbladder wall thickening consistent with acute cholecystitis.   The patient was admitted with the following diagnoses:  1. Acute cholecystitis.  2. Osteoarthritis.  3. Depression.  4. Benign prostatic hypertrophy.   HOSPITAL COURSE:  The patient was admitted through the ER where he was taken  directly to the OR by Dr. Abbey Chatters.  He had been made NPO in anticipation  of urgent surgery on the day of admission.  His preoperative diagnosis was  acute cholecystitis.  He underwent a laparoscopic cholecystectomy with  intraoperative angiogram.  Postoperative diagnosis was acute cholecystitis  with perforation.  The patient was in stable condition and sent to the floor  to recover.  Postoperative day 1, he was having some difficulty voiding but  otherwise stated he felt much better.  A JP drain had been placed intra  operatively and this was non-bilious in appearance.  His white count had  decreased to 10,400, hemoglobin was stable at 12.6.  The abdomen was soft  and moderately tender.  He was continued on IV fluids and soft, diabetic  diet was initiated.  On postoperative day 2, the patient felt gassy.  He was  passing a small amount of flatus but had not had a BM.  At this point his  abdomen was  distended but soft.  Incisions were clean and dry.  It was  unclear as to whether his abdominal distention was related to urinary  retention, constipation or slowly evolving postoperative ileus.  Choice  enema and I&O catheterization was ordered.  At 10 p.m. on the evening of  postoperative day 2, he had undergone his second I&O catheterization of the  day, the first one at around 1 p.m. for 300 mL, the second one at 400 mL.  On postoperative day 3, he was vomiting.  He had anorexia and he was  depressed at new diagnosis of being diabetic.  His abdomen was soft but  distended with hypoactive bowel sounds.  It was also noted that he was  having larger amounts of  outpatient from his JP drain.  Blood pressure was  up.  The patient had been on an alpha blocker for his BPH prior to admission  and because of his emesis oral medications have not been resumed.  He still  was also having difficulty with urinary retention and requiring frequent I&O  catheterizations.  On postoperative day 4 early in the morning, Dr. Johna Sheriff  was called in because the patient was having what was described as chest  pain.  EKG was unremarkable.  White count was 9,900, hemoglobin 13.  His  abdomen was quite distended thought and Dr. Johna Sheriff felt that this was the  etiology to the patient's chest pain, given the fact that he had a large  abdomen pushing up underneath the diaphragm and into the chest.  It was felt  that the patient had a postoperative ileus and an NG tube was placed.  Labs  and x-rays were ordered.  By that same morning, later in the day, the  patient was otherwise stable.  He was sleepy but arousable.  His abdomen was  soft and distended, no bowel sounds were present.  An NG tube was in place.  CT of the abdomen and  pelvis was checked to rule out bowel obstruction  versus abscess and a PIC line was ordered to start TNA to assist with wound  healing and suspected protein-calorie malnourished state.   CT of the abdomen and pelvis was completed.  This showed dilated proximal  small bowel without any definite transition zone.  The colon was  decompressed.  There was no abscess.  By postoperative day 5, the patient  said he was much better rested, more alert.  He was not having any flatus or  BM.  Also went into further discussion with the patient on that same date  regarding his hemoglobin A1c being elevated and confirmed that the patient  probably was diabetic.  Otherwise the patient was continued as treatment for  postoperative ileus.  He was also noted to have some coffee ground NG returns and it was uncertain if he was developing NG tube related gastritis,   so Protonix was added.  It is important to note that the patient had also  been placed on Unasyn in the immediate postoperative period and this was  continued as well.   Over the next few days, the patient was continued on TNA and did quite well.  A repeat free albumin  had increased from 10.6 to 11 on TNA.  His ileus  appeared to be resolving.  His NG tube was eventually discontinued and he  was started on clear liquids.  Diet was fully advanced.  JP drainage finally  decreased and the JP drain was discontinued by postoperative day 6.  By  postoperative day 8, a repeat pre-albumin was 13.5.  The patient had been  tolerating the full liquid diet for 24 hours and diet was advanced.  His  abdomen was soft and nontender.  Bowel sounds were present.  He had had  several stools during the ending, the night before.  He also had some mild  cellulitic skin changes at the insertion site on the right upper extremity,  and the [facility] line had been discontinued 24 hours before the cause of this.  By October 30, 2005, the patient remained stable.  He was tolerating his  diabetic diet.  He had received diabetic education from the nutritional and  medication and nursing care standpoint, and was deemed appropriate for  discharge home.   FINAL DISCHARGE DIAGNOSES:  1. Abdominal pain secondary to acute cholecystitis.  2. Status post laparoscopic cholecystectomy for acute cholecystitis and      perforated gallbladder.  3. Postoperative ileus resolved.  4. Protein-calorie malnutrition on TNA, resolving.  5. New onset diabetes.  6. Early cellulitic changes at the right upper extremity at the [facility] site,      now discontinued.  7. Hypertension.  8. Benign prostatic hypertrophy bladder outlet obstruction resolved.   DISCHARGE MEDICATIONS:  1. The patient is to resume any home medications.  2. He is to follow the [facility] home care instructions      regarding laparoscopic cholecystectomy.  3. He  will take Percocet 5/325 1-2 every 4-6 hours as needed for pain.  4. Diet restrictions will be diabetic.   ACTIVITY:  No driving for 2 weeks, no lifting for 5 weeks.   WOUND CARE:  As per the laparoscopic instructions.   FOLLOW UP:  1. He needs to call Dr. Maris Berger office to be seen in 2 weeks.  2. He needs to call his family doctor to follow up about the probable      diabetic diagnosis.  He has not been started on any oral hypoglycemic      agents at this time.      Allison L. Rennis Harding, N.P.      Leonie Man, M.D.  Electronically Signed    ALE/MEDQ  D:  11/25/2005  T:  11/25/2005  Job:  [id number]   cc:   Adolph Pollack, M.D.

## 2010-09-18 NOTE — Op Note (Signed)
NAMEBEXLEY, MCLESTER NO.:  [id number]   MEDICAL RECORD NO.:  [id number]          PATIENT TYPE:  INP   LOCATION:  2550                         FACILITY:  [facility]   PHYSICIAN:  Adolph Pollack, M.D.DATE OF BIRTH:  05-08-1926   DATE OF PROCEDURE:  10/21/2005  DATE OF DISCHARGE:                                 OPERATIVE REPORT   PREOPERATIVE DIAGNOSIS:  Acute cholecystitis.   POSTOPERATIVE DIAGNOSIS:  Acute cholecystitis with perforation.   PROCEDURE:  Laparoscopic cholecystectomy with interoperative cholangiogram.   SURGEON:  Adolph Pollack, M.D.   ASSISTANT:  Gabrielle Dare. Janee Morn, M.D.   ANESTHESIA:  General.   INDICATIONS:  This is a 77 year old male who, Monday, began feeling bad with  upper abdominal pain, back pain, and shoulder pain.  Tuesday, he felt bad,  but then Wednesday felt a little better, and then this morning has had  significant pain with some nausea leading him to come to the emergency  department.  He was evaluated there and noted to have gallstones as well as  elevated white blood cell count and his liver function test was abnormal.  He has right upper quadrant pain and guarding consistent with Murphy's sign  and findings consistent with acute cholecystitis.  He is thus brought to the  operating room.   PROCEDURE:  He is brought to the operating room, placed supine on the  operating table, and general anesthetic was administered.  The abdominal  wall was sterilely prepped and draped.  In view of his large abdominal size,  a supraumbilical incision was made incising the skin, subcutaneous tissue,  fascia, and peritoneum.  A pursestring suture of 0 Vicryl was placed around  the fascial edges.  A Hassan trocar was introduced to the peritoneal cavity  and pneumoperitoneum created by insufflation of CO2 gas.   Next, a laparoscope was introduced.  He was placed in the reversed  Trendelenburg position with the right side tilted slightly  upward.  I was  not able to visualize the gallbladder even with full extension of the  laparoscope because of omentum adherent to that area.  I placed an 11 mm  trocar in the epigastric region and two 5 mm trocars in the right mid  lateral abdomen.  I then used blunt dissection and was able to grasp this  and partially retract it toward the shoulder.  However, visualization was  inadequate because of the distance even from the supraumbilicus.  In the  right upper quadrant region, I created another incision and placed a 10 mm  trocar there and replaced that with the angled scope which allowed me  adequate visualization.  We then were able to grasp the infundibulum.  Once  I had dissected the gallbladder free from the omentum, I noted some pus and  a contained abscess and perforation.  I drained this out with a suction  device.  I then carefully mobilized the infundibulum with dissection  directly on the gallbladder using hydrodissection as well as some blunt  dissection.  It was inflamed and somewhat dilated.  I then created a window  around  it for quite a length.  I was able to identify the cystic artery and  create a window around it and the triangle of Calot.  I clipped the cystic  artery and divided it.  I then placed a clip just at the cystic duct  gallbladder junction and made a small incision into the cystic duct and  milking the cystic duct, got some stone fragments out of it thus  decompressing the cystic duct partially.  I passed a cholangiocatheter  through the intra-abdominal wall and placed it to the cystic duct and  cholangiograms performed.   Under real time fluoroscopy, dilute contrast material was injected to the  cystic duct.  The cystic duct was of long length.  The common hepatic, right  and left hepatic, and common bile ducts were all pacified.  The contrast  appeared to spill in the duodenum rapidly and I did not see any obvious  evidence of obstruction, although the  final report from the radiologist is  pending.   So, I used a vascular Endo-GIA stapler and clipped and divided the cystic  duct close to the gallbladder.  I then dissected the gallbladder free from  the liver bed.  I left part of the posterior wall which was intrahepatic.  Some stones spilled out and I retrieved these with the stone retrieval  device.  I then placed the gallbladder in an Endopouch bag and removed it  through the subumbilical incision.   I copiously irrigated out the gallbladder fossa and periumbilical area and  controlled bleeding with electrocautery.  I also cauterized the posterior  wall of the gallbladder that was left in.  Once bleeding and hemostasis was  adequate, I evacuated the fluid and it was becoming clear.  I then placed  Surgicel into the gallbladder fossa.  The supraumbilical fascial defect was  closed by tying down the pursestring suture.  The pneumoperitoneum was  released.  The skin incisions were closed with 4-0 Monocryl subcuticular  stitches followed by Steri-Strips and sterile dressings.  The drain was  hooked up to closed bulb suction.  He tolerated the procedure well without  any apparent complications.  He was subsequently taken to the recovery room  in satisfactory condition.      Adolph Pollack, M.D.  Electronically Signed     TJR/MEDQ  D:  10/21/2005  T:  10/21/2005  Job:  [id number]   cc:   Ellin Saba., M.D.  [address] [place]  [place] [address]

## 2010-09-27 ENCOUNTER — Other Ambulatory Visit: Payer: Self-pay | Admitting: Family Medicine

## 2010-10-19 ENCOUNTER — Other Ambulatory Visit: Payer: Self-pay | Admitting: Family Medicine

## 2010-10-20 ENCOUNTER — Other Ambulatory Visit: Payer: Self-pay | Admitting: Family Medicine

## 2010-10-22 ENCOUNTER — Telehealth: Payer: Self-pay | Admitting: Family Medicine

## 2010-10-22 NOTE — Telephone Encounter (Signed)
Pt called and is req a call back from Dr Scotty Court re: pt having some bleeding after surgery pt had. Pt req call back today.

## 2010-10-23 NOTE — Telephone Encounter (Signed)
Per Dr. Scotty Court pt was called; pt to make an appointment with Dr. Darvin Neighbours or his doctor at [facility]

## 2010-11-02 ENCOUNTER — Other Ambulatory Visit: Payer: Self-pay | Admitting: Family Medicine

## 2010-11-02 ENCOUNTER — Other Ambulatory Visit (INDEPENDENT_AMBULATORY_CARE_PROVIDER_SITE_OTHER): Payer: Medicare Other

## 2010-11-02 DIAGNOSIS — E119 Type 2 diabetes mellitus without complications: Secondary | ICD-10-CM

## 2010-11-02 LAB — HEMOGLOBIN A1C: Hgb A1c MFr Bld: 6.5 % (ref 4.6–6.5)

## 2010-11-30 ENCOUNTER — Other Ambulatory Visit: Payer: Self-pay | Admitting: Family Medicine

## 2010-12-09 ENCOUNTER — Other Ambulatory Visit: Payer: Self-pay | Admitting: Dermatology

## 2010-12-19 ENCOUNTER — Other Ambulatory Visit: Payer: Self-pay | Admitting: Family Medicine

## 2011-01-20 ENCOUNTER — Other Ambulatory Visit: Payer: Self-pay

## 2011-01-20 MED ORDER — GLIPIZIDE 10 MG PO TABS: 10.0000 mg | ORAL_TABLET | Freq: Two times a day (BID) | ORAL | Status: DC

## 2011-01-20 NOTE — Telephone Encounter (Signed)
rx sent into pharmacy

## 2011-01-23 ENCOUNTER — Other Ambulatory Visit: Payer: Self-pay | Admitting: Family Medicine

## 2011-01-26 ENCOUNTER — Other Ambulatory Visit: Payer: Self-pay

## 2011-01-26 MED ORDER — GLIPIZIDE 10 MG PO TABS: 10.0000 mg | ORAL_TABLET | Freq: Two times a day (BID) | ORAL | Status: DC

## 2011-01-26 NOTE — Telephone Encounter (Signed)
Received a request from [facility] of a 90 day supply; ok per Dr. Scotty Court.

## 2011-02-02 ENCOUNTER — Other Ambulatory Visit: Payer: Self-pay | Admitting: Family Medicine

## 2011-02-03 ENCOUNTER — Telehealth: Payer: Self-pay

## 2011-02-03 MED ORDER — LISINOPRIL 20 MG PO TABS: 20.0000 mg | ORAL_TABLET | Freq: Every day | ORAL | Status: DC

## 2011-02-03 NOTE — Telephone Encounter (Signed)
Pt request to have lisinopril 90 day supply because it is cheaper.  Pt is going to come in for a physical on 03/04/11. Pt aware rx has been sent.

## 2011-03-04 ENCOUNTER — Ambulatory Visit: Payer: Medicare Other | Admitting: Family Medicine

## 2011-03-12 ENCOUNTER — Telehealth: Payer: Self-pay | Admitting: Family Medicine

## 2011-03-12 DIAGNOSIS — E119 Type 2 diabetes mellitus without complications: Secondary | ICD-10-CM

## 2011-03-12 NOTE — Telephone Encounter (Signed)
Pt is req to get an a1c lab ordered. Pls advise so pt can be sch for lab.

## 2011-03-15 ENCOUNTER — Ambulatory Visit (INDEPENDENT_AMBULATORY_CARE_PROVIDER_SITE_OTHER): Payer: Medicare Other | Admitting: Family Medicine

## 2011-03-15 DIAGNOSIS — Z23 Encounter for immunization: Secondary | ICD-10-CM

## 2011-03-15 DIAGNOSIS — E119 Type 2 diabetes mellitus without complications: Secondary | ICD-10-CM

## 2011-03-15 NOTE — Telephone Encounter (Signed)
I placed the order, so he can schedule the draw

## 2011-03-17 NOTE — Progress Notes (Signed)
Quick Note:  Left voice message with results. ______ 

## 2011-03-19 ENCOUNTER — Telehealth: Payer: Self-pay | Admitting: Family Medicine

## 2011-03-19 NOTE — Telephone Encounter (Signed)
Pt needs a office visit ( 30 minutes ) to have Dr. Clent Ridges fill out paperwork. Please call pt to schedule and I have the paperwork at my desk.

## 2011-03-19 NOTE — Telephone Encounter (Signed)
Called and lft vm for pt to sch 30 ov to fill in forms, as noted. Waiting on call back.

## 2011-03-22 NOTE — Telephone Encounter (Signed)
Pt returned call and lft vm, stating that he had labs drawn on 03/15/11, so all he needs to sch is ov to have forms completed. I tried calling patient back, but ended up leaving another vm. Waiting on call back from pt.

## 2011-03-23 NOTE — Telephone Encounter (Signed)
Pt came by office and sch his 30 min ov for Wed 04/14/11 at 3:15pm.

## 2011-04-14 ENCOUNTER — Encounter: Payer: Self-pay | Admitting: Family Medicine

## 2011-04-14 ENCOUNTER — Ambulatory Visit (INDEPENDENT_AMBULATORY_CARE_PROVIDER_SITE_OTHER): Payer: Medicare Other | Admitting: Family Medicine

## 2011-04-14 VITALS — BP 140/80 | HR 81 | Temp 98.0°F | Ht 70.25 in | Wt 242.0 lb

## 2011-04-14 DIAGNOSIS — E119 Type 2 diabetes mellitus without complications: Secondary | ICD-10-CM

## 2011-04-14 DIAGNOSIS — N138 Other obstructive and reflux uropathy: Secondary | ICD-10-CM

## 2011-04-14 DIAGNOSIS — I10 Essential (primary) hypertension: Secondary | ICD-10-CM

## 2011-04-14 DIAGNOSIS — N139 Obstructive and reflux uropathy, unspecified: Secondary | ICD-10-CM

## 2011-04-14 DIAGNOSIS — R0602 Shortness of breath: Secondary | ICD-10-CM

## 2011-04-14 DIAGNOSIS — N401 Enlarged prostate with lower urinary tract symptoms: Secondary | ICD-10-CM

## 2011-04-14 DIAGNOSIS — F329 Major depressive disorder, single episode, unspecified: Secondary | ICD-10-CM

## 2011-04-14 DIAGNOSIS — M1990 Unspecified osteoarthritis, unspecified site: Secondary | ICD-10-CM

## 2011-04-14 NOTE — Progress Notes (Signed)
  Subjective:    Patient ID: Jon Fields, male    DOB: [birth year], 77 y.o.   MRN: [id number]  HPI 77 yr old male to be introduced to me after the retirement of Dr. Scotty Court. He also needs a form filled out for [employer] home since he has purchased a home there. He is widowed and lives by himself. He is reasonably healthy but sees that he will not be able to live alone much longer. He has no complaints today. His glucoses are stable. He does mention getting out of breath with exertion more quickly than he used to. He has not seen Dr. Shelle Iron in quite a while.    Review of Systems  Constitutional: Negative.   Respiratory: Positive for shortness of breath. Negative for cough, chest tightness and wheezing.   Cardiovascular: Negative.   Gastrointestinal: Negative.        Objective:   Physical Exam  Constitutional: He is oriented to person, place, and time. He appears well-developed and well-nourished.  Neck: Neck supple. No thyromegaly present.  Cardiovascular: Normal rate, regular rhythm, normal heart sounds and intact distal pulses.   Pulmonary/Chest: Effort normal. No respiratory distress. He has no wheezes. He has no rales.       Faint scattered crackles throughout   Musculoskeletal: He exhibits no edema.  Lymphadenopathy:    He has no cervical adenopathy.  Neurological: He is alert and oriented to person, place, and time.          Assessment & Plan:  Introductory visit for this gentleman. I did fill out forms for him to enroll at [facility]. He is set up for a cpx with me in the next few weeks

## 2011-04-15 ENCOUNTER — Encounter: Payer: Self-pay | Admitting: Family Medicine

## 2011-04-20 ENCOUNTER — Telehealth: Payer: Self-pay | Admitting: Family Medicine

## 2011-04-20 MED ORDER — GLIPIZIDE 10 MG PO TABS: 10.0000 mg | ORAL_TABLET | Freq: Two times a day (BID) | ORAL | Status: DC

## 2011-04-20 NOTE — Telephone Encounter (Signed)
Pt wants script for Glipizide sent to [facility] and a 90 day supply. I sent script e-scribe and pt aware.

## 2011-05-06 ENCOUNTER — Encounter: Payer: Self-pay | Admitting: Family Medicine

## 2011-05-06 ENCOUNTER — Ambulatory Visit (INDEPENDENT_AMBULATORY_CARE_PROVIDER_SITE_OTHER): Payer: Medicare Other | Admitting: Family Medicine

## 2011-05-06 VITALS — BP 110/60 | HR 88 | Ht 70.0 in | Wt 240.0 lb

## 2011-05-06 DIAGNOSIS — J8410 Pulmonary fibrosis, unspecified: Secondary | ICD-10-CM

## 2011-05-06 DIAGNOSIS — Z0000 Encounter for general adult medical examination without abnormal findings: Secondary | ICD-10-CM

## 2011-05-06 DIAGNOSIS — N139 Obstructive and reflux uropathy, unspecified: Secondary | ICD-10-CM

## 2011-05-06 DIAGNOSIS — E119 Type 2 diabetes mellitus without complications: Secondary | ICD-10-CM

## 2011-05-06 DIAGNOSIS — E559 Vitamin D deficiency, unspecified: Secondary | ICD-10-CM

## 2011-05-06 DIAGNOSIS — I10 Essential (primary) hypertension: Secondary | ICD-10-CM

## 2011-05-06 DIAGNOSIS — N401 Enlarged prostate with lower urinary tract symptoms: Secondary | ICD-10-CM

## 2011-05-06 LAB — BASIC METABOLIC PANEL
Chloride: 107 mEq/L (ref 96–112)
GFR: 66.02 mL/min (ref >60.00)
Potassium: 4.6 mEq/L (ref 3.5–5.1)
Sodium: 141 mEq/L (ref 135–145)

## 2011-05-06 LAB — POCT URINALYSIS DIPSTICK
Glucose, UA: NEGATIVE
Nitrite, UA: NEGATIVE
Spec Grav, UA: >=1.03

## 2011-05-06 LAB — CBC WITH DIFFERENTIAL/PLATELET
Basophils Relative: 0.3 % (ref 0.0–3.0)
Eosinophils Relative: 3.6 % (ref 0.0–5.0)
HCT: 40.7 % (ref 39.0–52.0)
Hemoglobin: 13.8 g/dL (ref 13.0–17.0)
MCV: 89.9 fl (ref 78.0–100.0)
Monocytes Absolute: 0.8 10*3/uL (ref 0.1–1.0)
Neutro Abs: 6.5 10*3/uL (ref 1.4–7.7)
Neutrophils Relative %: 73.3 % (ref 43.0–77.0)
RBC: 4.52 Mil/uL (ref 4.22–5.81)
WBC: 8.8 10*3/uL (ref 4.5–10.5)

## 2011-05-06 LAB — LIPID PANEL
LDL Cholesterol: 119 mg/dL — ABNORMAL HIGH (ref 0–99)
VLDL: 28.6 mg/dL (ref 0.0–40.0)

## 2011-05-06 LAB — HEPATIC FUNCTION PANEL
AST: 20 U/L (ref 0–37)
Alkaline Phosphatase: 113 U/L (ref 39–117)
Bilirubin, Direct: 0.1 mg/dL (ref 0.0–0.3)

## 2011-05-06 LAB — PSA: PSA: 0.74 ng/mL (ref 0.10–4.00)

## 2011-05-06 LAB — HEMOGLOBIN A1C: Hgb A1c MFr Bld: 7.1 % — ABNORMAL HIGH (ref 4.6–6.5)

## 2011-05-06 MED ORDER — NAPROXEN 500 MG PO TABS: 500.0000 mg | ORAL_TABLET | Freq: Two times a day (BID) | ORAL | Status: DC

## 2011-05-06 MED ORDER — GLIPIZIDE 10 MG PO TABS: 10.0000 mg | ORAL_TABLET | Freq: Two times a day (BID) | ORAL | Status: DC

## 2011-05-06 MED ORDER — LISINOPRIL 20 MG PO TABS: 20.0000 mg | ORAL_TABLET | Freq: Every day | ORAL | Status: DC

## 2011-05-06 MED ORDER — SERTRALINE HCL 100 MG PO TABS: 100.0000 mg | ORAL_TABLET | Freq: Every day | ORAL | Status: DC

## 2011-05-06 NOTE — Progress Notes (Signed)
  Subjective:    Patient ID: Jon Fields, male    DOB: 12-31-1926, 78 y.o.   MRN: [id number]  HPI 78 yr old male for a cpx. He is doing well in general, though he is a little stressed lately. He has purchased a unit at [employer] but has not moved in yet. Now he has must deal with getting rid of a lot of furniture and selling his house. He thinks a break up from his girlfriend is imminent. Physically he is stable although he may be a little more SOB on exertion. No chest pain. His am fasting glucoses have been going up to the range of 160-170.    Review of Systems  Constitutional: Negative.   HENT: Negative.   Eyes: Negative.   Respiratory: Negative.   Cardiovascular: Negative.   Gastrointestinal: Negative.   Genitourinary: Negative.   Musculoskeletal: Negative.   Skin: Negative.   Neurological: Negative.   Hematological: Negative.   Psychiatric/Behavioral: Negative.        Objective:   Physical Exam  Constitutional: He is oriented to person, place, and time. He appears well-developed and well-nourished. No distress.  HENT:  Head: Normocephalic and atraumatic.  Right Ear: External ear normal.  Left Ear: External ear normal.  Nose: Nose normal.  Mouth/Throat: Oropharynx is clear and moist. No oropharyngeal exudate.  Eyes: Conjunctivae and EOM are normal. Pupils are equal, round, and reactive to light. Right eye exhibits no discharge. Left eye exhibits no discharge. No scleral icterus.  Neck: Neck supple. No JVD present. No tracheal deviation present. No thyromegaly present.  Cardiovascular: Normal rate, regular rhythm, normal heart sounds and intact distal pulses.  Exam reveals no gallop and no friction rub.   No murmur heard.      EKG normal   Pulmonary/Chest: Effort normal and breath sounds normal. No respiratory distress. He has no wheezes. He has no rales. He exhibits no tenderness.  Abdominal: Soft. Bowel sounds are normal. He exhibits no distension and no mass. There  is no tenderness. There is no rebound and no guarding.  Genitourinary: Rectum normal, prostate normal and penis normal. Guaiac negative stool. No penile tenderness.  Musculoskeletal: Normal range of motion. He exhibits no edema and no tenderness.  Lymphadenopathy:    He has no cervical adenopathy.  Neurological: He is alert and oriented to person, place, and time. He has normal reflexes. No cranial nerve deficit. He exhibits normal muscle tone. Coordination normal.  Skin: Skin is warm and dry. No rash noted. He is not diaphoretic. No erythema. No pallor.  Psychiatric: He has a normal mood and affect. His behavior is normal. Judgment and thought content normal.          Assessment & Plan:  We will get fasting labs today. Check an A1c. It sounds like we may need to change his diabetic meds, and if so I plan to add Metformin to his Glipizide. He will see Dr. Magdalen Spatz in the next few months. He needs to see Dr. Shelle Iron soon also. He needs to lose weight.

## 2011-05-07 ENCOUNTER — Encounter: Payer: Self-pay | Admitting: Family Medicine

## 2011-05-07 MED ORDER — CIPROFLOXACIN HCL 500 MG PO TABS: 500.0000 mg | ORAL_TABLET | Freq: Two times a day (BID) | ORAL | Status: AC

## 2011-05-07 MED ORDER — ERGOCALCIFEROL 1.25 MG (50000 UT) PO CAPS: 50000.0000 [IU] | ORAL_CAPSULE | ORAL | Status: DC

## 2011-05-07 NOTE — Progress Notes (Signed)
Quick Note:  Spoke with pt, sent 2 scripts e-scribe and put a copy of lab results in mail. ______

## 2011-05-07 NOTE — Progress Notes (Signed)
Addended by: Aniceto Boss A on: 05/07/2011 05:48 PM   Modules accepted: Orders

## 2011-05-12 ENCOUNTER — Telehealth: Payer: Self-pay | Admitting: *Deleted

## 2011-05-12 NOTE — Telephone Encounter (Signed)
Pt wants to know if he is to continue Flomax.  If so, he needs refills called in.

## 2011-05-13 NOTE — Telephone Encounter (Signed)
Yes, please call in a one year supply

## 2011-05-14 MED ORDER — TAMSULOSIN HCL 0.4 MG PO CAPS: 0.4000 mg | ORAL_CAPSULE | Freq: Every day | ORAL | Status: DC

## 2011-05-14 NOTE — Telephone Encounter (Signed)
rx sent to pharmacy

## 2011-05-26 ENCOUNTER — Ambulatory Visit (INDEPENDENT_AMBULATORY_CARE_PROVIDER_SITE_OTHER): Payer: Medicare Other | Admitting: Pulmonary Disease

## 2011-05-26 ENCOUNTER — Encounter: Payer: Self-pay | Admitting: Pulmonary Disease

## 2011-05-26 VITALS — BP 124/72 | HR 64 | Temp 97.4°F | Ht 70.0 in | Wt 242.6 lb

## 2011-05-26 DIAGNOSIS — R0609 Other forms of dyspnea: Secondary | ICD-10-CM | POA: Insufficient documentation

## 2011-05-26 NOTE — Progress Notes (Signed)
  Subjective:    Patient ID: Jon Fields, male    DOB: [birth year], 78 y.o.   MRN: [id number]  HPI The patient comes in today for an acute sick visit.  He notes worsening dyspnea and exertion over the last 2-3 months, as well as a dry hacking cough.  The patient has a history of interstitial lung disease and hypersensitivity related to chronic nitrofurantoin use.  He had significant improvement in his exertional tolerance once the medication was discontinued.  He has been lost to followup since that time.  Patient's weight has been fairly stable since that last visit, and he is not had a recent cardiac evaluation.   Review of Systems  Constitutional: Negative for fever and unexpected weight change.  HENT: Positive for rhinorrhea. Negative for ear pain, nosebleeds, congestion, sore throat, sneezing, trouble swallowing, dental problem, postnasal drip and sinus pressure.   Eyes: Negative for redness and itching.  Respiratory: Positive for cough and shortness of breath. Negative for chest tightness and wheezing.   Cardiovascular: Negative for palpitations and leg swelling.  Gastrointestinal: Negative for nausea and vomiting.  Genitourinary: Negative for dysuria.  Musculoskeletal: Negative for joint swelling.  Skin: Negative for rash.  Neurological: Negative for headaches.  Hematological: Bruises/bleeds easily.  Psychiatric/Behavioral: Negative for dysphoric mood. The patient is not nervous/anxious.        Objective:   Physical Exam Overweight male in no acute distress Nose without purulence or discharge noted Oropharynx clear Chest with bibasilar crackles approximately 1/4 the way up bilaterally, no wheezing noted Cardiac exam with regular rate and rhythm, 2/6 systolic murmur Lower extremities with mild edema, no cyanosis noted Alert and oriented, moves all 4 extremities.      Assessment & Plan:

## 2011-05-26 NOTE — Assessment & Plan Note (Signed)
The patient is having significant worsening dyspnea on exertion over the last 2-3 months which is now interfering with his activities of daily living.  It is unclear whether this is related to his prior history of pulmonary fibrosis, or whether he may have a new ongoing pulmonary issues.  Would also consider whether he may have a cardiac issue, and what role that weight and deconditioning could be playing as well.  Would like to do repeat pulmonary function studies, and also do a followup high-resolution chest CT for evaluation of his prior interstitial disease.

## 2011-05-26 NOTE — Patient Instructions (Signed)
Will schedule for breathing tests next 2 weeks, and see back same day for review. Will do followup ct scan of chest to re-evaluate your prior scarring in lungs.

## 2011-05-26 NOTE — Progress Notes (Signed)
Addended by: Salli Quarry on: 05/26/2011 04:31 PM   Modules accepted: Orders

## 2011-05-28 ENCOUNTER — Ambulatory Visit (INDEPENDENT_AMBULATORY_CARE_PROVIDER_SITE_OTHER)
Admission: RE | Admit: 2011-05-28 | Discharge: 2011-05-28 | Disposition: A | Discharge status: Home / Self Care | Payer: Medicare Other | Source: Ambulatory Visit | Attending: Pulmonary Disease | Admitting: Pulmonary Disease

## 2011-05-28 DIAGNOSIS — R0609 Other forms of dyspnea: Secondary | ICD-10-CM

## 2011-06-04 ENCOUNTER — Encounter: Payer: Self-pay | Admitting: *Deleted

## 2011-06-15 ENCOUNTER — Telehealth: Payer: Self-pay | Admitting: Pulmonary Disease

## 2011-06-15 NOTE — Telephone Encounter (Signed)
I spoke with patient about results and he verbalized understanding and had no questions 

## 2011-06-16 ENCOUNTER — Other Ambulatory Visit: Payer: Self-pay | Admitting: Family Medicine

## 2011-06-18 ENCOUNTER — Ambulatory Visit (INDEPENDENT_AMBULATORY_CARE_PROVIDER_SITE_OTHER): Payer: Medicare Other | Admitting: Pulmonary Disease

## 2011-06-18 ENCOUNTER — Encounter: Payer: Self-pay | Admitting: Pulmonary Disease

## 2011-06-18 VITALS — BP 118/60 | HR 73 | Temp 97.6°F | Ht 71.0 in | Wt 247.0 lb

## 2011-06-18 DIAGNOSIS — R0609 Other forms of dyspnea: Secondary | ICD-10-CM

## 2011-06-18 DIAGNOSIS — R0989 Other specified symptoms and signs involving the circulatory and respiratory systems: Secondary | ICD-10-CM

## 2011-06-18 LAB — PULMONARY FUNCTION TEST

## 2011-06-18 NOTE — Assessment & Plan Note (Signed)
The patient has had increased dyspnea on exertion, but his high-resolution CT shows no significant progression of interstitial disease.  His PFTs also showed very little change in total lung capacity or diffusion capacity.  I suspect his change in breathing is secondary to conditioning and persistent obesity, however I cannot exclude a possible cardiac component.  I've asked the patient to work on weight loss and conditioning, and offered to refer him to pulmonary rehabilitation.  I will leave it to him and his primary doctor whether to consider a noninvasive cardiac workup.

## 2011-06-18 NOTE — Patient Instructions (Signed)
Work on weight loss and conditioning.  Consider pulmonary rehab program, and I can refer you. Will send a note to your primary doctor to consider cardiac evaluation if has not been done recently.  If doing well, followup with me in one year.

## 2011-06-18 NOTE — Progress Notes (Signed)
  Subjective:    Patient ID: Jon Fields, male    DOB: [birth year], 78 y.o.   MRN: [id number]  HPI The patient comes in today for followup of his recent CT chest and pulmonary function studies.  He has a history of interstitial disease related to drug toxicity, but had significant improvement in his breathing after discontinuation.  He recently has noticed worsening shortness of breath, however his x-rays and PFTs have shown no significant change in his pulmonary function or known interstitial disease.  I have reviewed the studies with him in detail, and answered all of his questions.   Review of Systems  Constitutional: Negative for fever and unexpected weight change.  HENT: Negative for ear pain, nosebleeds, congestion, sore throat, rhinorrhea, sneezing, trouble swallowing, dental problem, postnasal drip and sinus pressure.   Eyes: Positive for itching. Negative for redness.  Respiratory: Positive for cough and shortness of breath. Negative for chest tightness and wheezing.   Cardiovascular: Negative for palpitations and leg swelling.  Gastrointestinal: Negative for nausea and vomiting.  Genitourinary: Negative for dysuria.  Musculoskeletal: Negative for joint swelling.  Skin: Negative for rash.  Neurological: Negative for headaches.  Hematological: Does not bruise/bleed easily.  Psychiatric/Behavioral: Negative for dysphoric mood. The patient is not nervous/anxious.        Objective:   Physical Exam Overweight male in no acute distress Nose without purulent discharge noted Chest with basilar crackles, no wheezes Lower extremities with mild edema, no cyanosis Alert and oriented, moves all 4 extremities.       Assessment & Plan:

## 2011-06-18 NOTE — Progress Notes (Signed)
PFT done today. 

## 2011-06-24 ENCOUNTER — Telehealth: Payer: Self-pay | Admitting: Family Medicine

## 2011-06-24 MED ORDER — TAMSULOSIN HCL 0.4 MG PO CAPS: 0.4000 mg | ORAL_CAPSULE | Freq: Every day | ORAL | Status: DC

## 2011-06-24 NOTE — Telephone Encounter (Signed)
Pt is requesting a 90 day supply of Tamsulosin 0.4 mg and send to [facility] pharmacy. I sent script e-scribe.

## 2011-07-07 ENCOUNTER — Telehealth: Payer: Self-pay | Admitting: *Deleted

## 2011-07-07 DIAGNOSIS — R0602 Shortness of breath: Secondary | ICD-10-CM

## 2011-07-07 NOTE — Telephone Encounter (Addendum)
Pt thought he saw where Dr. Clent Ridges had written on pt's Pulmonology results, and would like to see Dr. Rennis Golden at [facility].

## 2011-07-08 NOTE — Telephone Encounter (Signed)
Please note

## 2011-07-08 NOTE — Telephone Encounter (Signed)
I called and left voice message for pt to return our call.

## 2011-07-08 NOTE — Telephone Encounter (Signed)
I would be happy to refer him to a cardiologist, but I am not sure who he has in mind. Please ask him

## 2011-07-08 NOTE — Telephone Encounter (Signed)
I left a message for pt to call back but the last message says he would like to see Dr. Rennis Golden.

## 2011-07-09 NOTE — Telephone Encounter (Signed)
Can you call the patient about this please? I do not know of any Dr. Rennis Golden in town

## 2011-07-12 NOTE — Telephone Encounter (Signed)
Convenient dates every week would be Wed afternoon, Thur am and Friday pm, please.

## 2011-07-12 NOTE — Telephone Encounter (Signed)
Called and spoke to pt.  He wanted to ask Dr. Clent Ridges if he was sure he should see a Cardiologist as he states Dr. Shelle Iron ruled out any lung problems?  His wife saw Dr. Myrtis Ser years ago, and that is who he would like to see if Dr Clent Ridges is agreeable.

## 2011-07-12 NOTE — Telephone Encounter (Signed)
Referral done to Dr. Myrtis Ser

## 2011-08-11 ENCOUNTER — Encounter: Payer: Self-pay | Admitting: Cardiovascular Disease

## 2011-08-11 ENCOUNTER — Ambulatory Visit (INDEPENDENT_AMBULATORY_CARE_PROVIDER_SITE_OTHER): Payer: Medicare Other | Admitting: Cardiovascular Disease

## 2011-08-11 VITALS — BP 136/76 | HR 60 | Ht 70.0 in | Wt 246.4 lb

## 2011-08-11 DIAGNOSIS — R0600 Dyspnea, unspecified: Secondary | ICD-10-CM

## 2011-08-11 DIAGNOSIS — J8410 Pulmonary fibrosis, unspecified: Secondary | ICD-10-CM

## 2011-08-11 DIAGNOSIS — R0609 Other forms of dyspnea: Secondary | ICD-10-CM

## 2011-08-11 NOTE — Progress Notes (Signed)
Jon Fields Date of Birth  [birth year] [facility]     [facility]  [address]    [address]   [address] [place], [place]  [address]    [place], [place]  [address] [phone]  Fax  [phone]  [phone]  Fax [phone]  Problem list: 1. History of pulmonary fibrosis 2. Diabetes Mellitus, 3. hypertension 4. Hypothyroidism  History of Present Illness:  Jon Fields is an 78 yo with hx of pulmonary fibrosis.  He has rare episodes of chest pains. He because of his pulmonary fibrosis he has limited exertional capacity. He's had a normal stress test a year ago. His chest pains are very atypical. He had one episode of chest pain that occurred with rest. He does not have any chest discomfort with exertion.  Current Outpatient Prescriptions on File Prior to Visit  Medication Sig Dispense Refill  . aspirin 81 MG tablet Take 81 mg by mouth daily. Take 3 times a week      . Blood Glucose Calibration (ACCU-CHEK AVIVA) SOLN by In Vitro route.        [name] glipiZIDE (GLUCOTROL) 10 MG tablet Take 1 tablet (10 mg total) by mouth 2 (two) times daily before a meal.  180 tablet  3  . glucose blood (ACCU-CHEK AVIVA PLUS) test strip        . Lancets (ACCU-CHEK MULTICLIX) lancets TEST AS DIRECTED.  102 each  1  . lisinopril (PRINIVIL,ZESTRIL) 20 MG tablet Take 1 tablet (20 mg total) by mouth daily.  90 tablet  3  . naproxen (NAPROSYN) 500 MG tablet Take 1 tablet (500 mg total) by mouth 2 (two) times daily with a meal.  180 tablet  3  . sertraline (ZOLOFT) 100 MG tablet Take 1 tablet (100 mg total) by mouth daily.  90 tablet  3  . Tamsulosin HCl (FLOMAX) 0.4 MG CAPS Take 1 capsule (0.4 mg total) by mouth daily.  90 capsule  3    No Known Allergies  Past Medical History  Diagnosis Date  . Sleep apnea, obstructive     sees Dr. Marcelyn Bruins, had sleep study 12-27-07  . Pulmonary fibrosis     sees Dr. Shelle Iron   . Hypothyroidism   . Diabetes mellitus     type 2  .  Depression   . Hyperlipidemia   . Hypertension   . Vitamin d deficiency   . Erectile dysfunction   . PVD (peripheral vascular disease)   . Osteoarthritis, generalized   . Arthritis   . Unsteady gait   . Vertebral compression fracture   . BPH with urinary obstruction     sees Dr. Patsi Sears    Past Surgical History  Procedure Date  . Cholecystectomy   . Appendectomy   . Transurethral resection of prostate May 2012    per Dr. Patsi Sears  . Colonoscopy 06-12-02    per Dr. Victorino Dike, diverticulosis only, repeat in 10  yrs     History  Smoking status  . Never Smoker   Smokeless tobacco  . Never Used    History  Alcohol Use No    No family history on file.  Reviw of Systems:  Reviewed in the HPI.  All other systems are negative.  Physical Exam: Blood pressure 136/76, pulse 60, height 5\' 10"  (1.778 m), weight 246 lb 6.4 oz (111.766 kg), SpO2 95.00%. General: Well developed, well nourished, in no acute distress.  Head: Normocephalic, atraumatic, sclera non-icteric, mucus membranes are  moist,   Neck: Supple. Carotids are 2 + without bruits. No JVD  Lungs: He has fine rales in both bases. He has decreased breath sounds in the left base.  Heart: regular rate.  normal  S1 S2. No murmurs, gallops or rubs.  Abdomen: Soft, non-tender, non-distended with normal bowel sounds. No hepatomegaly. No rebound/guarding. No masses.  Msk:  Strength and tone are normal  Extremities: No clubbing or cyanosis. No edema.  Distal pedal pulses are 2+ and equal bilaterally.  Neuro: Alert and oriented X 3. Moves all extremities spontaneously.  Psych:  Responds to questions appropriately with a normal affect.  ECG: From January reveals  normal Sinus  Rhythm  -Prominent R(V1) and right axis -consider right ventricular hypertrophy  -consider pulmonary disease.  Assessment / Plan:

## 2011-08-11 NOTE — Assessment & Plan Note (Signed)
Jon Fields presents with dyspnea on exertion. I suspect that most of this is due to his pulmonary fibrosis. He has fairly significant decrease of his DLCO.  He does not mention any episodes of angina. He had one episode of chest pain but was very atypical and is not reproduced with exercise.  I suggest that we given echocardiogram. If he has normal cardiac function and I'll see him on an as-needed basis.

## 2011-08-11 NOTE — Patient Instructions (Addendum)
Your physician has requested that you have an echocardiogram. Echocardiography is a painless test that uses sound waves to create images of your heart. It provides your doctor with information about the size and shape of your heart and how well your heart's chambers and valves are working. This procedure takes approximately one hour. There are no restrictions for this procedure.  Your physician recommends that you schedule a follow-up appointment as needed.  

## 2011-08-25 ENCOUNTER — Other Ambulatory Visit (HOSPITAL_COMMUNITY): Payer: Medicare Other

## 2011-08-26 ENCOUNTER — Other Ambulatory Visit: Payer: Self-pay

## 2011-08-26 ENCOUNTER — Ambulatory Visit (HOSPITAL_COMMUNITY): Payer: Medicare Other | Attending: Cardiovascular Disease

## 2011-08-26 DIAGNOSIS — J8410 Pulmonary fibrosis, unspecified: Secondary | ICD-10-CM | POA: Insufficient documentation

## 2011-08-26 DIAGNOSIS — R0989 Other specified symptoms and signs involving the circulatory and respiratory systems: Secondary | ICD-10-CM | POA: Insufficient documentation

## 2011-08-26 DIAGNOSIS — E119 Type 2 diabetes mellitus without complications: Secondary | ICD-10-CM | POA: Insufficient documentation

## 2011-08-26 DIAGNOSIS — I10 Essential (primary) hypertension: Secondary | ICD-10-CM | POA: Insufficient documentation

## 2011-08-26 DIAGNOSIS — I517 Cardiomegaly: Secondary | ICD-10-CM | POA: Insufficient documentation

## 2011-08-26 DIAGNOSIS — G4733 Obstructive sleep apnea (adult) (pediatric): Secondary | ICD-10-CM | POA: Insufficient documentation

## 2011-08-26 DIAGNOSIS — E669 Obesity, unspecified: Secondary | ICD-10-CM | POA: Insufficient documentation

## 2011-08-26 DIAGNOSIS — R0600 Dyspnea, unspecified: Secondary | ICD-10-CM

## 2011-08-26 DIAGNOSIS — R0609 Other forms of dyspnea: Secondary | ICD-10-CM | POA: Insufficient documentation

## 2011-08-26 DIAGNOSIS — R0602 Shortness of breath: Secondary | ICD-10-CM | POA: Insufficient documentation

## 2011-08-26 DIAGNOSIS — E785 Hyperlipidemia, unspecified: Secondary | ICD-10-CM | POA: Insufficient documentation

## 2011-09-01 ENCOUNTER — Telehealth: Payer: Self-pay | Admitting: Cardiology

## 2011-09-01 NOTE — Telephone Encounter (Signed)
PT CALLED WITH ECHO RESULTS. 

## 2011-09-01 NOTE — Telephone Encounter (Signed)
Fu call °Patient returning your call about test results °

## 2011-10-13 ENCOUNTER — Encounter: Payer: Self-pay | Admitting: Family Medicine

## 2011-10-13 ENCOUNTER — Ambulatory Visit (INDEPENDENT_AMBULATORY_CARE_PROVIDER_SITE_OTHER): Payer: Medicare Other | Admitting: Family Medicine

## 2011-10-13 VITALS — BP 130/70 | HR 74 | Temp 98.5°F | Wt 240.0 lb

## 2011-10-13 DIAGNOSIS — R5381 Other malaise: Secondary | ICD-10-CM

## 2011-10-13 DIAGNOSIS — R531 Weakness: Secondary | ICD-10-CM

## 2011-10-13 DIAGNOSIS — M546 Pain in thoracic spine: Secondary | ICD-10-CM

## 2011-10-13 DIAGNOSIS — Z79899 Other long term (current) drug therapy: Secondary | ICD-10-CM

## 2011-10-13 MED ORDER — LANCETS MISC: 1.0000 "application " | Freq: Every day | Status: DC

## 2011-10-13 NOTE — Progress Notes (Signed)
  Subjective:    Patient ID: IRISH PIECH, male    DOB: 09/07/1926, 78 y.o.   MRN: [id number]  HPI Here to discuss generalized weakness which comes and goes and also a sharp pain in the left middle back that started 4 days ago. No recent trauma. No cough or SOB. NO rashes. Using heat and Tylenol. The pain feels much better today. The fatigue has been going on for about 6 months. He had normal labs here recently although he did take vitamin D capsules for 12 weeks. He has not had a follow up level checked yet. He has been on Zoloft for years, and this was originally given to him for depression. He has not felt depressed for years and he no longer thinks he needs this med.    Review of Systems  Constitutional: Positive for fatigue. Negative for activity change, appetite change and unexpected weight change.  Respiratory: Negative.   Cardiovascular: Negative.   Neurological: Negative.   Psychiatric/Behavioral: Negative.        Objective:   Physical Exam  Constitutional: He appears well-developed and well-nourished.  Cardiovascular: Normal rate, regular rhythm, normal heart sounds and intact distal pulses.   Pulmonary/Chest: Effort normal and breath sounds normal. No respiratory distress. He has no wheezes. He has no rales.  Musculoskeletal:       Tender on the left middle back below the scapula   Skin: No rash noted.          Assessment & Plan:  He has strained a muscle in the back. Rest, heat , this should resolve soon. As for the fatigue, we will check some labs today. This may be a side effect of the Zoloft, so we will taper off this. He will take 1/2 tablet daily for 2 weeks and then stop it.

## 2011-10-19 ENCOUNTER — Encounter: Payer: Self-pay | Admitting: Family Medicine

## 2011-10-19 NOTE — Progress Notes (Signed)
Quick Note:  I tried to reach pt by phone and no answer. I put a copy of results in mail. ______ 

## 2011-12-01 ENCOUNTER — Telehealth: Payer: Self-pay | Admitting: Family Medicine

## 2011-12-01 NOTE — Telephone Encounter (Signed)
Caller: Cathy/Child; PCP: Nelwyn Salisbury.; CB#: [phone]; ; ; Call regarding Back Pain;  Daughter calling regarding father is having back pain, saw Dr. Clent Ridges for this about a month ago. Wants muscle relaxer called in that will not interfere with his other meds. [facility]ses [facility] [address], daughter is not with father.

## 2011-12-02 NOTE — Telephone Encounter (Signed)
Call in Flexeril 10 mg tid prn muscle spasms, #60 with 5 rf

## 2011-12-03 ENCOUNTER — Telehealth: Payer: Self-pay | Admitting: Family Medicine

## 2011-12-03 MED ORDER — CYCLOBENZAPRINE HCL 10 MG PO TABS: 10.0000 mg | ORAL_TABLET | Freq: Three times a day (TID) | ORAL | Status: AC | PRN

## 2011-12-03 NOTE — Telephone Encounter (Signed)
I sent script e-scribe and spoke with pt. 

## 2011-12-03 NOTE — Telephone Encounter (Signed)
Caller: Cathy/Child; PCP: Nelwyn Salisbury.; CB#: [phone];  Call regarding Back Pain; 2nd call re Intermittent back pain. Onset: before 10/13/11.  Not with father at this time. He is overweight.  [facility].  Per Epic note 12/02/11, Dr Clent Ridges ordered "Call in Flexeril 10 mg tid prn muscle spasms, #60 with 5 rf." [facility] [place] [place] [facility] [phone]. RX order called to pharmacy for caller with med question that was answered with available resouces per Med Question Call Guideline.

## 2011-12-29 ENCOUNTER — Other Ambulatory Visit: Payer: Self-pay | Admitting: Dermatology

## 2012-02-14 ENCOUNTER — Ambulatory Visit (INDEPENDENT_AMBULATORY_CARE_PROVIDER_SITE_OTHER): Payer: Medicare Other | Admitting: Family Medicine

## 2012-02-14 ENCOUNTER — Encounter: Payer: Self-pay | Admitting: Family Medicine

## 2012-02-14 VITALS — BP 128/76 | HR 101 | Temp 97.9°F | Wt 245.0 lb

## 2012-02-14 DIAGNOSIS — R5383 Other fatigue: Secondary | ICD-10-CM

## 2012-02-14 DIAGNOSIS — S20219A Contusion of unspecified front wall of thorax, initial encounter: Secondary | ICD-10-CM

## 2012-02-14 DIAGNOSIS — N390 Urinary tract infection, site not specified: Secondary | ICD-10-CM

## 2012-02-14 DIAGNOSIS — R269 Unspecified abnormalities of gait and mobility: Secondary | ICD-10-CM

## 2012-02-14 DIAGNOSIS — E538 Deficiency of other specified B group vitamins: Secondary | ICD-10-CM

## 2012-02-14 DIAGNOSIS — E119 Type 2 diabetes mellitus without complications: Secondary | ICD-10-CM

## 2012-02-14 DIAGNOSIS — R531 Weakness: Secondary | ICD-10-CM

## 2012-02-14 DIAGNOSIS — R2681 Unsteadiness on feet: Secondary | ICD-10-CM

## 2012-02-14 LAB — POCT URINALYSIS DIPSTICK
Bilirubin, UA: NEGATIVE
Nitrite, UA: POSITIVE
Spec Grav, UA: 1.025
pH, UA: 5.5

## 2012-02-14 NOTE — Progress Notes (Signed)
  Subjective:    Patient ID: Jon Fields, male    DOB: 1926/12/22, 78 y.o.   MRN: [id number]  HPI Here for multiple complaints. He has been feeling very fatigued for the past few months and he says he needs to sit down to rest after he walks short distances. No SOB or chest pain. His glucoses are running a bit high with am fasting readings in the 130s. His balance has been off and he has fallen several times in the past few months. One time he had a hard fall about 2 months ago and went to Urgent Care because of pain and  swelling in the right ankle. Xrays were negative for fractures , and he was told he had a severe ankle sprain. He was given a rigid boot to wear for a month, which he did. Now the ankle is still a little tender. His main complaint today is 3 days of intermittent sharp pains the the left side. No changes in urinations or BMs, no nausea or fever. I asked him if he had fallen in the past week, and he answers that he is not sure if he did or not. He is now using a cane for balance.    Review of Systems  Constitutional: Positive for fatigue. Negative for fever.  Respiratory: Negative.   Cardiovascular: Negative.   Gastrointestinal: Negative.   Genitourinary: Positive for flank pain. Negative for dysuria, urgency, frequency and hematuria.  Neurological: Positive for weakness. Negative for dizziness, tremors, seizures, syncope, facial asymmetry, speech difficulty, light-headedness, numbness and headaches.       Objective:   Physical Exam  Constitutional: He is oriented to person, place, and time.       Alert, unsteady on his feet, using a cane   Neck: No thyromegaly present.  Cardiovascular: Normal rate, regular rhythm and intact distal pulses.  Exam reveals no gallop and no friction rub.   No murmur heard. Pulmonary/Chest: Effort normal and breath sounds normal. No respiratory distress. He has no wheezes. He has no rales.  Abdominal: Soft. Bowel sounds are normal. He  exhibits no distension and no mass. There is no tenderness. There is no rebound and no guarding.  Musculoskeletal:       There is a large ecchymosis over the left ribs, and he is quite tender in this area. No crepitus or hematoma . The right medial and lateral ankle is mildly swollen and tender. Full ROM.   Lymphadenopathy:    He has no cervical adenopathy.  Neurological: He is alert and oriented to person, place, and time.          Assessment & Plan:  He has either cracked or bruised ribs, and this is certainly the cause of his side pain. Get rib Xrays tomorrow. He will use naproxen for pain. Obviously he fell recently, and falls seem to be a recent problem. Get labs today to look for other metabolic issues and to check his diabetes. He is still healing from the ankle sprain, so I suggested he wear a Neoprene ankle sleeve for more support.

## 2012-02-15 ENCOUNTER — Telehealth: Payer: Self-pay | Admitting: Family Medicine

## 2012-02-15 DIAGNOSIS — M25571 Pain in right ankle and joints of right foot: Secondary | ICD-10-CM

## 2012-02-15 LAB — BASIC METABOLIC PANEL
BUN: 28 mg/dL — ABNORMAL HIGH (ref 6–23)
Calcium: 9.4 mg/dL (ref 8.4–10.5)
GFR: 62.08 mL/min (ref >60.00)
Glucose, Bld: 97 mg/dL (ref 70–99)

## 2012-02-15 LAB — CBC WITH DIFFERENTIAL/PLATELET
Basophils Relative: 0.6 % (ref 0.0–3.0)
Eosinophils Absolute: 0.4 10*3/uL (ref 0.0–0.7)
Eosinophils Relative: 4.8 % (ref 0.0–5.0)
HCT: 43.8 % (ref 39.0–52.0)
Lymphs Abs: 2.3 10*3/uL (ref 0.7–4.0)
MCHC: 32.5 g/dL (ref 30.0–36.0)
MCV: 93.1 fl (ref 78.0–100.0)
Monocytes Absolute: 1 10*3/uL (ref 0.1–1.0)
Neutro Abs: 4.9 10*3/uL (ref 1.4–7.7)
Neutrophils Relative %: 57.1 % (ref 43.0–77.0)
RBC: 4.71 Mil/uL (ref 4.22–5.81)

## 2012-02-15 LAB — HEPATIC FUNCTION PANEL
AST: 19 U/L (ref 0–37)
Albumin: 4 g/dL (ref 3.5–5.2)
Alkaline Phosphatase: 78 U/L (ref 39–117)
Bilirubin, Direct: 0 mg/dL (ref 0.0–0.3)
Total Protein: 7.8 g/dL (ref 6.0–8.3)

## 2012-02-15 NOTE — Telephone Encounter (Signed)
Tell him I have referred him to Orthopedics because they can Xray it there in their office but they needs to see him anyway

## 2012-02-15 NOTE — Telephone Encounter (Signed)
Patient called stating that he would like to have an xray of his right ankle as he is having pain in it. Please advise/assist. Patient would like this today.

## 2012-02-15 NOTE — Telephone Encounter (Signed)
I left a voice message with the below information. 

## 2012-02-16 ENCOUNTER — Ambulatory Visit (INDEPENDENT_AMBULATORY_CARE_PROVIDER_SITE_OTHER)
Admission: RE | Admit: 2012-02-16 | Discharge: 2012-02-16 | Disposition: A | Discharge status: Home / Self Care | Payer: Medicare Other | Source: Ambulatory Visit | Attending: Family Medicine | Admitting: Family Medicine

## 2012-02-16 DIAGNOSIS — S20219A Contusion of unspecified front wall of thorax, initial encounter: Secondary | ICD-10-CM

## 2012-02-16 LAB — URINE CULTURE

## 2012-02-18 NOTE — Progress Notes (Signed)
Quick Note:  I spoke with pt ______ 

## 2012-02-21 NOTE — Progress Notes (Signed)
Quick Note:  I left voice message with below information. ______ 

## 2012-03-07 ENCOUNTER — Telehealth: Payer: Self-pay | Admitting: Family Medicine

## 2012-03-07 MED ORDER — ACCU-CHEK MULTICLIX LANCETS MISC: Status: DC

## 2012-03-07 NOTE — Telephone Encounter (Signed)
Refill request for Lancets and I did send script e-scribe.

## 2012-03-31 ENCOUNTER — Other Ambulatory Visit: Payer: Self-pay

## 2012-03-31 ENCOUNTER — Telehealth: Payer: Self-pay | Admitting: Family Medicine

## 2012-03-31 ENCOUNTER — Telehealth: Payer: Self-pay

## 2012-03-31 MED ORDER — GLUCOSE BLOOD VI STRP: ORAL_STRIP | Status: DC

## 2012-03-31 NOTE — Telephone Encounter (Signed)
rx sent to pharmacy

## 2012-03-31 NOTE — Telephone Encounter (Signed)
Patient called stating that he need a refill of his glucose blood (ACCU-CHEK AVIVA PLUS) test strip [[id number]] sent to [facility]

## 2012-03-31 NOTE — Telephone Encounter (Signed)
Rx sent to pharmacy   

## 2012-03-31 NOTE — Telephone Encounter (Signed)
[facility] [address]. Please assist.

## 2012-04-02 ENCOUNTER — Other Ambulatory Visit: Payer: Self-pay | Admitting: Family Medicine

## 2012-04-03 ENCOUNTER — Inpatient Hospital Stay (HOSPITAL_COMMUNITY): Payer: Medicare Other

## 2012-04-03 ENCOUNTER — Ambulatory Visit: Payer: Medicare Other | Admitting: Family

## 2012-04-03 ENCOUNTER — Inpatient Hospital Stay (HOSPITAL_COMMUNITY)
Admission: EM | Admit: 2012-04-03 | Discharge: 2012-04-03 | DRG: 074 | Disposition: A | Discharge status: Home / Self Care | Payer: Medicare Other | Attending: Internal Medicine | Admitting: Internal Medicine

## 2012-04-03 ENCOUNTER — Encounter (HOSPITAL_COMMUNITY): Payer: Self-pay | Admitting: Emergency Medicine

## 2012-04-03 ENCOUNTER — Telehealth: Payer: Self-pay

## 2012-04-03 ENCOUNTER — Emergency Department (HOSPITAL_COMMUNITY): Payer: Medicare Other

## 2012-04-03 ENCOUNTER — Telehealth: Payer: Self-pay | Admitting: Family Medicine

## 2012-04-03 DIAGNOSIS — J8410 Pulmonary fibrosis, unspecified: Secondary | ICD-10-CM | POA: Diagnosis present

## 2012-04-03 DIAGNOSIS — E1165 Type 2 diabetes mellitus with hyperglycemia: Secondary | ICD-10-CM | POA: Insufficient documentation

## 2012-04-03 DIAGNOSIS — N390 Urinary tract infection, site not specified: Secondary | ICD-10-CM

## 2012-04-03 DIAGNOSIS — F3289 Other specified depressive episodes: Secondary | ICD-10-CM | POA: Diagnosis present

## 2012-04-03 DIAGNOSIS — G4733 Obstructive sleep apnea (adult) (pediatric): Secondary | ICD-10-CM | POA: Diagnosis present

## 2012-04-03 DIAGNOSIS — Z9089 Acquired absence of other organs: Secondary | ICD-10-CM

## 2012-04-03 DIAGNOSIS — E119 Type 2 diabetes mellitus without complications: Secondary | ICD-10-CM | POA: Diagnosis present

## 2012-04-03 DIAGNOSIS — E785 Hyperlipidemia, unspecified: Secondary | ICD-10-CM | POA: Diagnosis present

## 2012-04-03 DIAGNOSIS — R269 Unspecified abnormalities of gait and mobility: Secondary | ICD-10-CM | POA: Diagnosis present

## 2012-04-03 DIAGNOSIS — R29810 Facial weakness: Secondary | ICD-10-CM | POA: Diagnosis present

## 2012-04-03 DIAGNOSIS — Z7982 Long term (current) use of aspirin: Secondary | ICD-10-CM

## 2012-04-03 DIAGNOSIS — H93239 Hyperacusis, unspecified ear: Secondary | ICD-10-CM | POA: Diagnosis present

## 2012-04-03 DIAGNOSIS — M159 Polyosteoarthritis, unspecified: Secondary | ICD-10-CM | POA: Diagnosis present

## 2012-04-03 DIAGNOSIS — G510 Bell's palsy: Principal | ICD-10-CM | POA: Diagnosis present

## 2012-04-03 DIAGNOSIS — E039 Hypothyroidism, unspecified: Secondary | ICD-10-CM | POA: Diagnosis present

## 2012-04-03 DIAGNOSIS — F329 Major depressive disorder, single episode, unspecified: Secondary | ICD-10-CM | POA: Diagnosis present

## 2012-04-03 DIAGNOSIS — I10 Essential (primary) hypertension: Secondary | ICD-10-CM | POA: Diagnosis present

## 2012-04-03 DIAGNOSIS — I739 Peripheral vascular disease, unspecified: Secondary | ICD-10-CM | POA: Diagnosis present

## 2012-04-03 DIAGNOSIS — Z79899 Other long term (current) drug therapy: Secondary | ICD-10-CM

## 2012-04-03 HISTORY — DX: Reserved for concepts with insufficient information to code with codable children: IMO0002

## 2012-04-03 HISTORY — DX: Type 2 diabetes mellitus with hyperglycemia: E11.65

## 2012-04-03 LAB — CBC WITH DIFFERENTIAL/PLATELET
Basophils Relative: 0 % (ref 0–1)
Eosinophils Absolute: 0.2 10*3/uL (ref 0.0–0.7)
Eosinophils Relative: 2 % (ref 0–5)
MCH: 30.6 pg (ref 26.0–34.0)
MCHC: 34 g/dL (ref 30.0–36.0)
Neutrophils Relative %: 75 % (ref 43–77)
Platelets: 167 10*3/uL (ref 150–400)

## 2012-04-03 LAB — COMPREHENSIVE METABOLIC PANEL
ALT: 16 U/L (ref 0–53)
Albumin: 3.6 g/dL (ref 3.5–5.2)
Alkaline Phosphatase: 88 U/L (ref 39–117)
BUN: 21 mg/dL (ref 6–23)
Calcium: 9.1 mg/dL (ref 8.4–10.5)
Potassium: 3.6 mEq/L (ref 3.5–5.1)
Sodium: 142 mEq/L (ref 135–145)
Total Protein: 6.9 g/dL (ref 6.0–8.3)

## 2012-04-03 LAB — URINALYSIS, ROUTINE W REFLEX MICROSCOPIC
Glucose, UA: NEGATIVE mg/dL
Ketones, ur: NEGATIVE mg/dL
Nitrite: POSITIVE — AB
Specific Gravity, Urine: 1.029 (ref 1.005–1.030)
pH: 5 (ref 5.0–8.0)

## 2012-04-03 LAB — URINE MICROSCOPIC-ADD ON

## 2012-04-03 LAB — PROTIME-INR: INR: 1.13 (ref 0.00–1.49)

## 2012-04-03 LAB — APTT: aPTT: 34 seconds (ref 24–37)

## 2012-04-03 MED ORDER — CEPHALEXIN 500 MG PO CAPS: 500.0000 mg | ORAL_CAPSULE | Freq: Two times a day (BID) | ORAL | Status: DC

## 2012-04-03 MED ORDER — DEXTROSE 5 % IV SOLN
1.0000 g | Freq: Once | INTRAVENOUS | Status: AC
  Administered 2012-04-03: 1 g via INTRAVENOUS
  Filled 2012-04-03: qty 10

## 2012-04-03 MED ORDER — CEFTRIAXONE SODIUM 1 G IJ SOLR: 1.0000 g | Freq: Once | INTRAMUSCULAR | Status: DC

## 2012-04-03 NOTE — ED Notes (Signed)
Admit Doctor at bedside.  

## 2012-04-03 NOTE — ED Notes (Signed)
Onset 5 days ago lip numbness and right sided facial droop continued today with headache onset one day ago intermittent. Denies pain currently 0/10.

## 2012-04-03 NOTE — ED Provider Notes (Addendum)
History     CSN: [id number]  Arrival date & time 04/03/12  1102   First MD Initiated Contact with Patient 04/03/12 1111      Chief Complaint  Patient presents with  . Facial Droop    (Consider location/radiation/quality/duration/timing/severity/associated sxs/prior treatment) HPI Pt presents with R facial droop and lip numbness since waking the morning of 11/27. Pt is currently pain free. No history of CVA or recent illness. No fever chills. No trauma.  No ext weakness, numbness or lack of coordination.  Past Medical History  Diagnosis Date  . Sleep apnea, obstructive     sees Dr. Marcelyn Bruins, had sleep study 12-27-07  . Pulmonary fibrosis     sees Dr. Shelle Iron   . Hypothyroidism   . Diabetes mellitus     type 2  . Depression   . Hyperlipidemia   . Hypertension   . Vitamin D deficiency   . Erectile dysfunction   . PVD (peripheral vascular disease)   . Osteoarthritis, generalized   . Arthritis   . Unsteady gait   . Vertebral compression fracture   . BPH with urinary obstruction     sees Dr. Patsi Sears  . Diabetes mellitus type II, uncontrolled     Past Surgical History  Procedure Date  . Cholecystectomy   . Appendectomy   . Transurethral resection of prostate May 2012    per Dr. Patsi Sears  . Colonoscopy 06-12-02    per Dr. Victorino Dike, diverticulosis only, repeat in 10  yrs     No family history on file.  History  Substance Use Topics  . Smoking status: Never Smoker   . Smokeless tobacco: Never Used  . Alcohol Use: No      Review of Systems  Constitutional: Negative for fever, chills and fatigue.  HENT: Positive for drooling. Negative for trouble swallowing, neck pain, neck stiffness and voice change.   Eyes: Negative for pain, redness and visual disturbance.  Respiratory: Negative for cough and shortness of breath.   Cardiovascular: Negative for chest pain, palpitations and leg swelling.  Gastrointestinal: Negative for nausea, vomiting and abdominal  pain.  Genitourinary: Negative for dysuria.  Musculoskeletal: Negative for myalgias and back pain.  Skin: Negative for rash and wound.  Neurological: Positive for facial asymmetry and numbness. Negative for dizziness, tremors, syncope, speech difficulty, weakness, light-headedness and headaches.    Allergies  Review of patient's allergies indicates no known allergies.  Home Medications   Current Outpatient Rx  Name  Route  Sig  Dispense  Refill  . ASPIRIN 81 MG PO TABS   Oral   Take 81 mg by mouth 3 (three) times a week. Mon, Wed Fri         . GLIPIZIDE 10 MG PO TABS   Oral   Take 1 tablet (10 mg total) by mouth 2 (two) times daily before a meal.   180 tablet   3   . LISINOPRIL 20 MG PO TABS   Oral   Take 1 tablet (20 mg total) by mouth daily.   90 tablet   3   . SERTRALINE HCL 100 MG PO TABS   Oral   Take 1 tablet (100 mg total) by mouth daily.   90 tablet   3   . TAMSULOSIN HCL 0.4 MG PO CAPS   Oral   Take 1 capsule (0.4 mg total) by mouth daily.   90 capsule   3     Pt requested a 90 day supply   .  ACCU-CHEK AVIVA VI SOLN   In Vitro   by In Vitro route.           . CEPHALEXIN 500 MG PO CAPS   Oral   Take 1 capsule (500 mg total) by mouth 2 (two) times daily.   20 capsule   0   . GLUCOSE BLOOD VI STRP      Use as directed.   Dx code: 250.00   100 each   11   . ACCU-CHEK MULTICLIX LANCETS MISC      Use as instructed   102 each   11   . LANCETS MISC   Topical   Apply 1 application topically daily.   100 each   3   . NAPROXEN 500 MG PO TABS      TAKE ONE TABLET BY MOUTH TWICE DAILY WITH MEALS   180 tablet   0     BP 138/74  Pulse 74  Temp 98.5 F (36.9 C) (Oral)  Resp 20  SpO2 95%  Physical Exam  Nursing note and vitals reviewed. Constitutional: He is oriented to person, place, and time. He appears well-developed and well-nourished. No distress.  HENT:  Head: Normocephalic and atraumatic.  Mouth/Throat: Oropharynx is  clear and moist.  Eyes: EOM are normal. Pupils are equal, round, and reactive to light.  Neck: Normal range of motion. Neck supple.       No bruits  Cardiovascular: Normal rate and regular rhythm.   Pulmonary/Chest: Effort normal and breath sounds normal. No respiratory distress. He has no wheezes. He has no rales. He exhibits no tenderness.  Abdominal: Soft. Bowel sounds are normal. He exhibits no distension and no mass. There is no tenderness. There is no rebound and no guarding.  Musculoskeletal: Normal range of motion. He exhibits no edema and no tenderness.  Neurological: He is alert and oriented to person, place, and time.       R lower facial droop. Numbness to R lips. +forehead sparing. 5/5 motor in all ext, sensation intact. Finger to nose intact  Skin: Skin is warm and dry. No rash noted. No erythema.  Psychiatric: He has a normal mood and affect. His behavior is normal.    ED Course  Procedures (including critical care time)  Labs Reviewed  CBC WITH DIFFERENTIAL - Abnormal; Notable for the following:    WBC 10.9 (*)     RBC 4.05 (*)     Hemoglobin 12.4 (*)     HCT 36.5 (*)     Neutro Abs 8.2 (*)     Monocytes Absolute 1.2 (*)     All other components within normal limits  COMPREHENSIVE METABOLIC PANEL - Abnormal; Notable for the following:    Glucose, Bld 149 (*)     GFR calc non Af Amer 74 (*)     GFR calc Af Amer 86 (*)     All other components within normal limits  URINALYSIS, ROUTINE W REFLEX MICROSCOPIC - Abnormal; Notable for the following:    APPearance TURBID (*)     Hgb urine dipstick LARGE (*)     Bilirubin Urine SMALL (*)     Protein, ur 30 (*)     Nitrite POSITIVE (*)     Leukocytes, UA LARGE (*)     All other components within normal limits  URINE MICROSCOPIC-ADD ON - Abnormal; Notable for the following:    Squamous Epithelial / LPF FEW (*)     Bacteria, UA MANY (*)  All other components within normal limits  PROTIME-INR  APTT  URINE CULTURE    Ct Head Wo Contrast  04/03/2012  *RADIOLOGY REPORT*  Clinical Data: Right-sided facial droop.  Lip numbness.  CT HEAD WITHOUT CONTRAST  Technique:  Contiguous axial images were obtained from the base of the skull through the vertex without contrast.  Comparison: None.  Findings: Mild generalized atrophy is present.  No acute cortical infarct, hemorrhage, or mass lesion is present.  Moderate periventricular and subcortical white matter changes are evident bilaterally. Remote lacunar infarcts are present in the basal ganglia bilaterally.  The ventricles are proportionate to the degree of atrophy.  No significant extra-axial fluid collection is present.  A polyp or mucous retention cyst is present in the left maxillary sinus.  The paranasal sinuses and mastoid air cells are otherwise clear.  The osseous skull is intact.  Atherosclerotic calcifications are noted in the cavernous carotid arteries and at the dural margin of the vertebral arteries.  IMPRESSION:  1.  No acute intracranial abnormality. 2.  Atrophy and moderate white matter disease. 3.  Lacunar infarcts in the basal ganglia bilaterally appear remote. 4.  Atherosclerosis.   Original Report Authenticated By: Marin Roberts, M.D.    Mr Brain Wo Contrast  04/03/2012  *RADIOLOGY REPORT*  Clinical Data: Acute sensory changes to the right side of the face.  MRI HEAD WITHOUT CONTRAST  Technique:  Multiplanar, multiecho pulse sequences of the brain and surrounding structures were obtained according to standard protocol without intravenous contrast.  Comparison: The CT head without contrast 04/03/2012.  Findings: The diffusion weighted images demonstrate no evidence for acute or subacute infarction.    Mild generalized atrophy and periventricular white matter changes within normal limits for age. No hemorrhage or mass lesion is present.  Flow is present in the major intracranial arteries.  The globes and orbits are intact. Leftward nasal septal deviation  extends 7 mm from the midline.  The ventricles are proportionate to the degree of atrophy.  No significant extra-axial fluid collection is present.  A polyp or mucous retention cyst is present in the left maxillary sinus. Fluid in the left mastoid air cells is likely chronic.  IMPRESSION:  1.  No acute or focal abnormality of the brain to explain the patient's symptoms. 2.  Normal appearance of the brain for age. 3.  Polyp or mucous retention cyst in the left maxillary sinus. 4.  Minimal fluid in the left mastoid air cells is likely chronic.   Original Report Authenticated By: Marin Roberts, M.D.      1. Facial weakness   2. Bell's palsy   3. UTI (lower urinary tract infection)      Date: 04/03/2012  Rate: 71  Rhythm: normal sinus rhythm  QRS Axis: right  Intervals: QRS prolonged  ST/T Wave abnormalities: nonspecific T wave changes  Conduction Disutrbances:right bundle branch block  Narrative Interpretation:   Old EKG Reviewed: changes noted   MDM   Discussed with Dr Amada Jupiter who will see in ED.  [facility] to admit.      Loren Racer, MD 04/03/12 1339  After eval by Neuro, think symptoms due to bell's palsy and not CVA. Recommend against admission and get MRI in ED. May be d/c home if no acute findings.   Relayed to CDU PA. Will f/u MRI results.   Loren Racer, MD 04/04/12 [phone]

## 2012-04-03 NOTE — Consult Note (Signed)
Chief Complaint: right facial droop  HPI:                                                                                                                                         Jon Fields is an 78 y.o. male who states 4 days ago on Wednesday he noted he had a facial droop on the right. He had no other symptoms.  Upon talking to patient I noted his right eye was not closing all the way.  He states he has never had this problem in the past.  Patient does take ASA 81 mg three times a week but not daily due to increasd bruising in the past. He also states his hearing on the right side has been increased over the last 4 days.    Per notes, "[facility] nurse, Shann Medal, called to give her assessment of pt. She states that pt came to her last week on Wednesday to report that he noticed he couldn't smile on the right side of his face but that he had no pain or weakness. She states that this morning the pt c/o HA and tingling on rt side of his head, BP 114/50, P 88, and reports that pt says his fbs have been running 135-150 for about a month. Patient did not come to the ED due to the holidays".  LSN: 4 days ago tPA Given: No: out of window  Past Medical History  Diagnosis Date  . Sleep apnea, obstructive     sees Dr. Marcelyn Bruins, had sleep study 12-27-07  . Pulmonary fibrosis     sees Dr. Shelle Iron   . Hypothyroidism   . Diabetes mellitus     type 2  . Depression   . Hyperlipidemia   . Hypertension   . Vitamin D deficiency   . Erectile dysfunction   . PVD (peripheral vascular disease)   . Osteoarthritis, generalized   . Arthritis   . Unsteady gait   . Vertebral compression fracture   . BPH with urinary obstruction     sees Dr. Patsi Sears    Past Surgical History  Procedure Date  . Cholecystectomy   . Appendectomy   . Transurethral resection of prostate May 2012    per Dr. Patsi Sears  . Colonoscopy 06-12-02    per Dr. Victorino Dike, diverticulosis only, repeat in 10  yrs      No family history on file. Social History: mother --DM, HTN; Father--AAA (died of AAA)  Allergies: No Known Allergies  Medications:  No current facility-administered medications for this encounter.   Current Outpatient Prescriptions  Medication Sig Dispense Refill  . aspirin 81 MG tablet Take 81 mg by mouth 3 (three) times a week. Mon, Wed Fri      . glipiZIDE (GLUCOTROL) 10 MG tablet Take 1 tablet (10 mg total) by mouth 2 (two) times daily before a meal.  180 tablet  3  . lisinopril (PRINIVIL,ZESTRIL) 20 MG tablet Take 1 tablet (20 mg total) by mouth daily.  90 tablet  3  . sertraline (ZOLOFT) 100 MG tablet Take 1 tablet (100 mg total) by mouth daily.  90 tablet  3  . Tamsulosin HCl (FLOMAX) 0.4 MG CAPS Take 1 capsule (0.4 mg total) by mouth daily.  90 capsule  3  . Blood Glucose Calibration (ACCU-CHEK AVIVA) SOLN by In Vitro route.        [name] glucose blood (ACCU-CHEK AVIVA PLUS) test strip Use as directed.   Dx code: 250.00  100 each  11  . Lancets (ACCU-CHEK MULTICLIX) lancets Use as instructed  102 each  11  . Lancets MISC Apply 1 application topically daily.  100 each  3    ROS:                                                                                                                                       History obtained from the patient  General ROS: negative for - chills, fatigue, fever, night sweats, weight gain or weight loss Psychological ROS: negative for - behavioral disorder, hallucinations, memory difficulties, mood swings or suicidal ideation Ophthalmic ROS: negative for - blurry vision, double vision, eye pain or loss of vision ENT ROS: Positive for hyperacusis right ear Allergy and Immunology ROS: negative for - hives or itchy/watery eyes Hematological and Lymphatic ROS: negative for - bleeding problems, bruising or swollen lymph  nodes Endocrine ROS: negative for - galactorrhea, hair pattern changes, polydipsia/polyuria or temperature intolerance Respiratory ROS: negative for - cough, hemoptysis, shortness of breath or wheezing Cardiovascular ROS: negative for - chest pain, dyspnea on exertion, edema or irregular heartbeat Gastrointestinal ROS: negative for - abdominal pain, diarrhea, hematemesis, nausea/vomiting or stool incontinence Genito-Urinary ROS: negative for - dysuria, hematuria, incontinence or urinary frequency/urgency Musculoskeletal ROS: negative for - joint swelling or muscular weakness Neurological ROS: as noted in HPI Dermatological ROS: negative for rash and skin lesion changes  Neurologic Examination:  Blood pressure 136/75, pulse 74, temperature 98 F (36.7 C), temperature source Oral, resp. rate 14, SpO2 99.00%.  Mental Status: Alert, oriented, thought content appropriate.  Speech fluent without evidence of aphasia.  Able to follow 3 step commands without difficulty. Cranial Nerves: II: Discs flat bilaterally; Visual fields grossly normal, pupils equal, round, reactive to light and accommodation III,IV, VI: ptosis not present, unable to close right eyelid and burry eyelashes, right eyelid was easily opened when asked to forcefully close his eyes. extra-ocular motions intact bilaterally V,VII: smile asymmetric on right, facial light touch sensation normal bilaterally VIII: hearing increased on right IX,X: gag reflex present XI: bilateral shoulder shrug XII: midline tongue extension Motor: Right : Upper extremity   5/5    Left:     Upper extremity   5/5  Lower extremity   5/5     Lower extremity   5/5 Tone and bulk:normal tone throughout; no atrophy noted Sensory: Pinprick and light touch intact throughout, bilaterally Deep Tendon Reflexes: 1+ and symmetric throughout Plantars: Right:  downgoing   Left: downgoing Cerebellar: normal finger-to-nose,  normal heel-to-shin test CV: pulses palpable throughout      Results for orders placed during the hospital encounter of 04/03/12 (from the past 48 hour(s))  CBC WITH DIFFERENTIAL     Status: Abnormal   Collection Time   04/03/12 11:40 AM      Component Value Range Comment   WBC 10.9 (*) 4.0 - 10.5 K/uL    RBC 4.05 (*) 4.22 - 5.81 MIL/uL    Hemoglobin 12.4 (*) 13.0 - 17.0 g/dL    HCT 62.9 (*) 52.8 - 52.0 %    MCV 90.1  78.0 - 100.0 fL    MCH 30.6  26.0 - 34.0 pg    MCHC 34.0  30.0 - 36.0 g/dL    RDW 41.3  24.4 - 01.0 %    Platelets 167  150 - 400 K/uL    Neutrophils Relative 75  43 - 77 %    Neutro Abs 8.2 (*) 1.7 - 7.7 K/uL    Lymphocytes Relative 12  12 - 46 %    Lymphs Abs 1.3  0.7 - 4.0 K/uL    Monocytes Relative 11  3 - 12 %    Monocytes Absolute 1.2 (*) 0.1 - 1.0 K/uL    Eosinophils Relative 2  0 - 5 %    Eosinophils Absolute 0.2  0.0 - 0.7 K/uL    Basophils Relative 0  0 - 1 %    Basophils Absolute 0.0  0.0 - 0.1 K/uL   COMPREHENSIVE METABOLIC PANEL     Status: Abnormal   Collection Time   04/03/12 11:40 AM      Component Value Range Comment   Sodium 142  135 - 145 mEq/L    Potassium 3.6  3.5 - 5.1 mEq/L    Chloride 106  96 - 112 mEq/L    CO2 25  19 - 32 mEq/L    Glucose, Bld 149 (*) 70 - 99 mg/dL    BUN 21  6 - 23 mg/dL    Creatinine, Ser 2.72  0.50 - 1.35 mg/dL    Calcium 9.1  8.4 - 53.6 mg/dL    Total Protein 6.9  6.0 - 8.3 g/dL    Albumin 3.6  3.5 - 5.2 g/dL    AST 13  0 - 37 U/L    ALT 16  0 - 53 U/L    Alkaline Phosphatase 88  39 - 117 U/L    Total Bilirubin 0.6  0.3 - 1.2 mg/dL    GFR calc non Af Amer 74 (*) >90 mL/min    GFR calc Af Amer 86 (*) >90 mL/min   PROTIME-INR     Status: Normal   Collection Time   04/03/12 11:40 AM      Component Value Range Comment   Prothrombin Time 14.3  11.6 - 15.2 seconds    INR 1.13  0.00 - 1.49   APTT     Status: Normal   Collection Time   04/03/12  11:40 AM      Component Value Range Comment   aPTT 34  24 - 37 seconds    Ct Head Wo Contrast  04/03/2012  *RADIOLOGY REPORT*  Clinical Data: Right-sided facial droop.  Lip numbness.  CT HEAD WITHOUT CONTRAST  Technique:  Contiguous axial images were obtained from the base of the skull through the vertex without contrast.  Comparison: None.  Findings: Mild generalized atrophy is present.  No acute cortical infarct, hemorrhage, or mass lesion is present.  Moderate periventricular and subcortical white matter changes are evident bilaterally. Remote lacunar infarcts are present in the basal ganglia bilaterally.  The ventricles are proportionate to the degree of atrophy.  No significant extra-axial fluid collection is present.  A polyp or mucous retention cyst is present in the left maxillary sinus.  The paranasal sinuses and mastoid air cells are otherwise clear.  The osseous skull is intact.  Atherosclerotic calcifications are noted in the cavernous carotid arteries and at the dural margin of the vertebral arteries.  IMPRESSION:  1.  No acute intracranial abnormality. 2.  Atrophy and moderate white matter disease. 3.  Lacunar infarcts in the basal ganglia bilaterally appear remote. 4.  Atherosclerosis.   Original Report Authenticated By: Marin Roberts, M.D.      Felicie Morn PA-C [facility] [phone]  04/03/2012, 1:26 PM   Assessment: 78 y.o. male who presented to ED with 4 day history of right facial droop, inability to completely close eyelid on right eye and hyperacusis right ear. Likely Bell's palsy but would recommend obtaining MRI to confirm.   Stroke Risk Factors - diabetes mellitus, hyperlipidemia and hypertension  Plan: 1. Continue ASA at home dose and frequency 2. MRI brain--If negative no further neurological workup needed.    I have seen and evaluated the patient. I have reviewed the above note and made appropriate changes. 78 yo M with a mild bell's palsy. I  typically recommend steroids in this situation, but given his concomitant infection and mild symptoms, it may be reasonable to hold off in his case.   Ritta Slot, MD [facility] [phone]  If 7pm- 7am, please page neurology on call at [phone].

## 2012-04-03 NOTE — H&P (Signed)
[facility] History and Physical  JEDIAEL PLUTE ZOX:[id number] DOB: May 17, 1926 DOA: 04/03/2012  Referring physician: Dr. Ranae Palms PCP: Nelwyn Salisbury, MD  Specialists:   Chief Complaint: Right facial weakness  HPI: Jon Fields is a 78 y.o. male resident of an assisted living facility with history of multiple medical problems including diabetes and hypertension who presents with above complaints. He states that about 5 days ago he noted that his 'smile was funny/different' and also had numbness on the right side of his lips. He reports that his also been drooling on the right side of his mouth, and this has been going on for the past one month or so. He also will He denies any weakness in the extremities, change in his vision, headaches, dysphagia and no dizziness. He was seen in the ED and a CT scan of his head was done and showed no acute intracranial abnormality, lacunar infarcts in the basal ganglia bilaterally appear to be remote. She states he has been on aspirin daily. He also reports he has been receiving physical therapy because of an ankle fracture he sustained some time ago and has a history of unsteady gait.  Review of Systems: The patient denies anorexia, fever, weight loss,, vision loss, decreased hearing, hoarseness, chest pain, syncope, peripheral edema, balance deficits, hemoptysis, abdominal pain, melena, hematochezia, severe indigestion/heartburn, hematuria, incontinence, muscle weakness, suspicious skin lesions, transient blindness, depression, unusual weight change, abnormal bleeding.  Past Medical History  Diagnosis Date  . Sleep apnea, obstructive     sees Dr. Marcelyn Bruins, had sleep study 12-27-07  . Pulmonary fibrosis     sees Dr. Shelle Iron   . Hypothyroidism   . Diabetes mellitus     type 2  . Depression   . Hyperlipidemia   . Hypertension   . Vitamin D deficiency   . Erectile dysfunction   . PVD (peripheral vascular disease)   . Osteoarthritis,  generalized   . Arthritis   . Unsteady gait   . Vertebral compression fracture   . BPH with urinary obstruction     sees Dr. Patsi Sears  . Diabetes mellitus type II, uncontrolled    Past Surgical History  Procedure Date  . Cholecystectomy   . Appendectomy   . Transurethral resection of prostate May 2012    per Dr. Patsi Sears  . Colonoscopy 06-12-02    per Dr. Victorino Dike, diverticulosis only, repeat in 10  yrs    Social History:  reports that he has never smoked. He has never used smokeless tobacco. He reports that he does not drink alcohol or use illicit drugs.  where does patient live- ALF   No Known Allergies   family history -his mother had a stroke also had diabetes.   Prior to Admission medications   Medication Sig Start Date End Date Taking? Authorizing Provider  aspirin 81 MG tablet Take 81 mg by mouth 3 (three) times a week. Mon, Wed Fri   Yes Historical Provider, MD  glipiZIDE (GLUCOTROL) 10 MG tablet Take 1 tablet (10 mg total) by mouth 2 (two) times daily before a meal. 05/06/11  Yes Nelwyn Salisbury, MD  lisinopril (PRINIVIL,ZESTRIL) 20 MG tablet Take 1 tablet (20 mg total) by mouth daily. 05/06/11  Yes Nelwyn Salisbury, MD  sertraline (ZOLOFT) 100 MG tablet Take 1 tablet (100 mg total) by mouth daily. 05/06/11  Yes Nelwyn Salisbury, MD  Tamsulosin HCl (FLOMAX) 0.4 MG CAPS Take 1 capsule (0.4 mg total) by mouth daily. 06/24/11  Yes  Nelwyn Salisbury, MD  Blood Glucose Calibration (ACCU-CHEK AVIVA) SOLN by In Vitro route.      Historical Provider, MD  glucose blood (ACCU-CHEK AVIVA PLUS) test strip Use as directed.   Dx code: 250.00 03/31/12   Nelwyn Salisbury, MD  Lancets (ACCU-CHEK MULTICLIX) lancets Use as instructed 03/07/12   Nelwyn Salisbury, MD  Lancets MISC Apply 1 application topically daily. 10/13/11   Nelwyn Salisbury, MD   Physical Exam: Filed Vitals:   04/03/12 1215 04/03/12 1330 04/03/12 1350 04/03/12 1415  BP: 136/75  135/65 137/68  Pulse: 74  73 66  Temp:  98.1 F (36.7 C)      TempSrc:      Resp: 14  17 18   SpO2: 99%  100% 100%   Constitutional: Vital signs reviewed.  Patient is a well-developed and well-nourished  in no acute distress and cooperative with exam. Alert and oriented x3.  Head: Normocephalic and atraumatic, right facial weakness Mouth: no erythema or exudates, MMM Eyes: PERRL, EOMI, conjunctivae normal, No scleral icterus.  Neck: Supple, Trachea midline normal ROM, No JVD, mass, thyromegaly, or carotid bruit present.  Cardiovascular: RRR, S1 normal, S2 normal, no MRG, pulses symmetric and intact bilaterally Pulmonary/Chest: CTAB, no wheezes, or rhonchi Abdominal: Soft. Non-tender, non-distended, bowel sounds are normal, no masses, organomegaly, or guarding present.  GU: no CVA tenderness Musculoskeletal: No joint deformities, erythema, or stiffness, ROM full and no nontender Hematology: no cervical, inginal, or axillary adenopathy.  Neurological: A&O x3, right facial weakness,Strength is normal and symmetric bilaterally, cranial nerve II-XII are grossly intact, sensory intact to light touch bilaterally.  Skin: Warm, dry and intact. No rash, cyanosis, or clubbing.  Psychiatric: Normal mood and affect. speech and behavior is normal. Judgment and thought content normal. Cognition and memory are normal.     Labs on Admission:  Basic Metabolic Panel:  Lab 04/03/12 2130  NA 142  K 3.6  CL 106  CO2 25  GLUCOSE 149*  BUN 21  CREATININE 0.99  CALCIUM 9.1  MG --  PHOS --   Liver Function Tests:  Lab 04/03/12 1140  AST 13  ALT 16  ALKPHOS 88  BILITOT 0.6  PROT 6.9  ALBUMIN 3.6   No results found for this basename: LIPASE:5,AMYLASE:5 in the last 168 hours No results found for this basename: AMMONIA:5 in the last 168 hours CBC:  Lab 04/03/12 1140  WBC 10.9*  NEUTROABS 8.2*  HGB 12.4*  HCT 36.5*  MCV 90.1  PLT 167   Cardiac Enzymes: No results found for this basename: CKTOTAL:5,CKMB:5,CKMBINDEX:5,TROPONINI:5 in the last 168  hours  BNP (last 3 results) No results found for this basename: PROBNP:3 in the last 8760 hours CBG: No results found for this basename: GLUCAP:5 in the last 168 hours  Radiological Exams on Admission: Ct Head Wo Contrast  04/03/2012  *RADIOLOGY REPORT*  Clinical Data: Right-sided facial droop.  Lip numbness.  CT HEAD WITHOUT CONTRAST  Technique:  Contiguous axial images were obtained from the base of the skull through the vertex without contrast.  Comparison: None.  Findings: Mild generalized atrophy is present.  No acute cortical infarct, hemorrhage, or mass lesion is present.  Moderate periventricular and subcortical white matter changes are evident bilaterally. Remote lacunar infarcts are present in the basal ganglia bilaterally.  The ventricles are proportionate to the degree of atrophy.  No significant extra-axial fluid collection is present.  A polyp or mucous retention cyst is present in the left maxillary sinus.  The paranasal sinuses and mastoid air cells are otherwise clear.  The osseous skull is intact.  Atherosclerotic calcifications are noted in the cavernous carotid arteries and at the dural margin of the vertebral arteries.  IMPRESSION:  1.  No acute intracranial abnormality. 2.  Atrophy and moderate white matter disease. 3.  Lacunar infarcts in the basal ganglia bilaterally appear remote. 4.  Atherosclerosis.   Original Report Authenticated By: Marin Roberts, M.D.       Assessment/Plan Active Problems:  Facial weakness -As discussed above, admit for CVA workup-MRI MRA 2-D echo carotid Dopplers hemoglobin A1c and fasting lipid profile. -Neuro consulted per EDP, await eval and further recommendations - will change to Plavix beginning in a.m. -PT OT consult HYPOTHYROIDISM -Will check TSH, he has history of hypothyroidism but on no thyroid replacement-follow  DIABETES MELLITUS, TYPE II -Monitor Accu-Cheks, and color with sliding scale follow resume glipizide. Check  hemoglobin A1c as above.  OBSTRUCTIVE SLEEP APNEA/history of pulmonary fibrosis -not on CPAP , he is followed by Dr. Shelle Iron  Essential hypertension, benign -Hold off lisinopril for now, monitor blood pressures allow for permissive hypertension pending MRI and follow.  UNSTEADY GAIT -PT consult as above, follow   Code Status: Full Family Communication: No family available, directly with patient at bedside Disposition Plan: Admit to 4 north telemetry.  Time spent: >82mins  Kela Millin [facility] Pager [phone]  If 7PM-7AM, please contact night-coverage [email or web address] Password [facility] 04/03/2012, 2:47 PM

## 2012-04-03 NOTE — ED Notes (Signed)
Neurology PA at bedside

## 2012-04-03 NOTE — ED Provider Notes (Signed)
4:27 PM Pt is in CDU holding for MRI brain.  Sign out received from Dr Ranae Palms.  Pt with suspected Bell's Palsy, seen by neuro and by hospitalist.  Plan is for MRI brain.  If negative for acute findings, pt may be d/c home with PCP follow up, to continue daily aspirin.   6:12 PM I have spoken with patient and family member regarding MRI results and plan.  UA pending.  Pt does cath himself twice daily per family member.  Pt will attempt to provide urine now. Pt has no needs at this time.  7:04 PM UA positive for infection.  Will d/c home with keflex.  1 dose of IV rocephin to be given here prior to d/c.  Urine culture sent.  Pt advised to continue his daily aspirin and to follow closely with Dr Clent Ridges, PCP.  Pt verbalizes understanding and agrees with plan.  Given return precautions.    Results for orders placed during the hospital encounter of 04/03/12  CBC WITH DIFFERENTIAL      Component Value Range   WBC 10.9 (*) 4.0 - 10.5 K/uL   RBC 4.05 (*) 4.22 - 5.81 MIL/uL   Hemoglobin 12.4 (*) 13.0 - 17.0 g/dL   HCT 40.9 (*) 81.1 - 91.4 %   MCV 90.1  78.0 - 100.0 fL   MCH 30.6  26.0 - 34.0 pg   MCHC 34.0  30.0 - 36.0 g/dL   RDW 78.2  95.6 - 21.3 %   Platelets 167  150 - 400 K/uL   Neutrophils Relative 75  43 - 77 %   Neutro Abs 8.2 (*) 1.7 - 7.7 K/uL   Lymphocytes Relative 12  12 - 46 %   Lymphs Abs 1.3  0.7 - 4.0 K/uL   Monocytes Relative 11  3 - 12 %   Monocytes Absolute 1.2 (*) 0.1 - 1.0 K/uL   Eosinophils Relative 2  0 - 5 %   Eosinophils Absolute 0.2  0.0 - 0.7 K/uL   Basophils Relative 0  0 - 1 %   Basophils Absolute 0.0  0.0 - 0.1 K/uL  COMPREHENSIVE METABOLIC PANEL      Component Value Range   Sodium 142  135 - 145 mEq/L   Potassium 3.6  3.5 - 5.1 mEq/L   Chloride 106  96 - 112 mEq/L   CO2 25  19 - 32 mEq/L   Glucose, Bld 149 (*) 70 - 99 mg/dL   BUN 21  6 - 23 mg/dL   Creatinine, Ser 0.86  0.50 - 1.35 mg/dL   Calcium 9.1  8.4 - 57.8 mg/dL   Total Protein 6.9  6.0 - 8.3 g/dL   Albumin 3.6  3.5 - 5.2 g/dL   AST 13  0 - 37 U/L   ALT 16  0 - 53 U/L   Alkaline Phosphatase 88  39 - 117 U/L   Total Bilirubin 0.6  0.3 - 1.2 mg/dL   GFR calc non Af Amer 74 (*) >90 mL/min   GFR calc Af Amer 86 (*) >90 mL/min  URINALYSIS, ROUTINE W REFLEX MICROSCOPIC      Component Value Range   Color, Urine YELLOW  YELLOW   APPearance TURBID (*) CLEAR   Specific Gravity, Urine 1.029  1.005 - 1.030   pH 5.0  5.0 - 8.0   Glucose, UA NEGATIVE  NEGATIVE mg/dL   Hgb urine dipstick LARGE (*) NEGATIVE   Bilirubin Urine SMALL (*) NEGATIVE   Ketones, ur NEGATIVE  NEGATIVE mg/dL   Protein, ur 30 (*) NEGATIVE mg/dL   Urobilinogen, UA 0.2  0.0 - 1.0 mg/dL   Nitrite POSITIVE (*) NEGATIVE   Leukocytes, UA LARGE (*) NEGATIVE  PROTIME-INR      Component Value Range   Prothrombin Time 14.3  11.6 - 15.2 seconds   INR 1.13  0.00 - 1.49  APTT      Component Value Range   aPTT 34  24 - 37 seconds  URINE MICROSCOPIC-ADD ON      Component Value Range   Squamous Epithelial / LPF FEW (*) RARE   WBC, UA TOO NUMEROUS TO COUNT  <3 WBC/hpf   RBC / HPF 0-2  <3 RBC/hpf   Bacteria, UA MANY (*) RARE   Urine-Other MUCOUS PRESENT     Ct Head Wo Contrast  04/03/2012  *RADIOLOGY REPORT*  Clinical Data: Right-sided facial droop.  Lip numbness.  CT HEAD WITHOUT CONTRAST  Technique:  Contiguous axial images were obtained from the base of the skull through the vertex without contrast.  Comparison: None.  Findings: Mild generalized atrophy is present.  No acute cortical infarct, hemorrhage, or mass lesion is present.  Moderate periventricular and subcortical white matter changes are evident bilaterally. Remote lacunar infarcts are present in the basal ganglia bilaterally.  The ventricles are proportionate to the degree of atrophy.  No significant extra-axial fluid collection is present.  A polyp or mucous retention cyst is present in the left maxillary sinus.  The paranasal sinuses and mastoid air cells are otherwise  clear.  The osseous skull is intact.  Atherosclerotic calcifications are noted in the cavernous carotid arteries and at the dural margin of the vertebral arteries.  IMPRESSION:  1.  No acute intracranial abnormality. 2.  Atrophy and moderate white matter disease. 3.  Lacunar infarcts in the basal ganglia bilaterally appear remote. 4.  Atherosclerosis.   Original Report Authenticated By: Marin Roberts, M.D.    Mr Brain Wo Contrast  04/03/2012  *RADIOLOGY REPORT*  Clinical Data: Acute sensory changes to the right side of the face.  MRI HEAD WITHOUT CONTRAST  Technique:  Multiplanar, multiecho pulse sequences of the brain and surrounding structures were obtained according to standard protocol without intravenous contrast.  Comparison: The CT head without contrast 04/03/2012.  Findings: The diffusion weighted images demonstrate no evidence for acute or subacute infarction.    Mild generalized atrophy and periventricular white matter changes within normal limits for age. No hemorrhage or mass lesion is present.  Flow is present in the major intracranial arteries.  The globes and orbits are intact. Leftward nasal septal deviation extends 7 mm from the midline.  The ventricles are proportionate to the degree of atrophy.  No significant extra-axial fluid collection is present.  A polyp or mucous retention cyst is present in the left maxillary sinus. Fluid in the left mastoid air cells is likely chronic.  IMPRESSION:  1.  No acute or focal abnormality of the brain to explain the patient's symptoms. 2.  Normal appearance of the brain for age. 3.  Polyp or mucous retention cyst in the left maxillary sinus. 4.  Minimal fluid in the left mastoid air cells is likely chronic.   Original Report Authenticated By: Marin Roberts, M.D.       [place], [place] 04/03/12 1949

## 2012-04-03 NOTE — Telephone Encounter (Signed)
Appointment note states pt can't smile on right side of face and fasting blood sugars are high. Padonda spoke with Dr. Clent Ridges, pt's PCP, and he suggests that pt go to ED immediately.  Called to speak to pt and advised him to report to the ED immediately instead of coming to the office. Pt verbalized understanding. Appointment canceled

## 2012-04-03 NOTE — ED Notes (Signed)
Patient stated onset 5 days ago lip numbness with right side facial droop. Lives at assisted living facility and did not come to the ED due to the holidays.  Patient ax4 answering and following commands appropriate.

## 2012-04-03 NOTE — Telephone Encounter (Signed)
[facility] nurse, Shann Medal, called to give her assessment of pt. She states that pt came to her last week on Wednesday to report that he noticed he couldn't smile on the right side of his face but that he had no pain or weakness. She states that this morning the pt c/o HA and tingling on rt side of his head, BP 114/50, P 88, and reports that pt says his fbs have been running 135-150 for about a month. Nurse was thinking pt could be evaluated in the office and if necessary, then he could be sent to ED for further evaluation. Spoke with Padonda and she feels that ED is the best option. Advised nurse that pt should go to ED.

## 2012-04-03 NOTE — Telephone Encounter (Signed)
[facility] employee called to make appt for pt. She stated he could not smile on his right side of face, and his fasting blood sugars had been high, (150-170). She said this had been going on for 1-2 weeks. She stated his BP was fine, his pulse was normal, and she just wanted to get him checked out. I asked her if he was having stroke symptoms, and she said, " No, I'm not saying that, I don't want to go there". She said he was there with her, and is fine, talking, no chest pains, just the smile and sometimes has a problem when he drinks with a straw. I told her Dr Clent Ridges did not have anything, but our nurse practitioner was available and she said that would be ok. She also stated the fasting blood sugars had been going on since his last visit w/ Dr Clent Ridges, but pt failed to mention this to MD.

## 2012-04-03 NOTE — ED Notes (Signed)
Spoke with Verlon Au RN and Nursing home stated onset of lip numbness and right side facial droop onset 5 days ago with headache last night continued today intermittent.  Patient stated to [facility] his blood sugar running high for months 135-150.

## 2012-04-04 ENCOUNTER — Telehealth: Payer: Self-pay | Admitting: Family Medicine

## 2012-04-04 MED ORDER — TAMSULOSIN HCL 0.4 MG PO CAPS: 0.4000 mg | ORAL_CAPSULE | Freq: Every day | ORAL | Status: DC

## 2012-04-04 NOTE — Telephone Encounter (Signed)
Refill request for Tamsulosin 0.4 mg take 1 po qd and send to [facility] ( 30 day supply )

## 2012-04-04 NOTE — Telephone Encounter (Signed)
I sent script e-scribe. 

## 2012-04-04 NOTE — ED Provider Notes (Signed)
Medical screening examination/treatment/procedure(s) were conducted as a shared visit with non-physician practitioner(s) and myself.  I personally evaluated the patient during the encounter   Azaria Stegman, MD 04/04/12 0832 

## 2012-04-05 LAB — URINE CULTURE: Colony Count: >=100000

## 2012-04-06 NOTE — ED Notes (Signed)
Patient treated with Keflex-sensitive to same-chart appended per protocol MD.

## 2012-04-07 ENCOUNTER — Ambulatory Visit (INDEPENDENT_AMBULATORY_CARE_PROVIDER_SITE_OTHER): Payer: Medicare Other | Admitting: Family Medicine

## 2012-04-07 ENCOUNTER — Encounter: Payer: Self-pay | Admitting: Family Medicine

## 2012-04-07 VITALS — BP 132/76 | HR 100 | Temp 98.1°F | Wt 236.0 lb

## 2012-04-07 DIAGNOSIS — L309 Dermatitis, unspecified: Secondary | ICD-10-CM

## 2012-04-07 DIAGNOSIS — L259 Unspecified contact dermatitis, unspecified cause: Secondary | ICD-10-CM

## 2012-04-07 DIAGNOSIS — N390 Urinary tract infection, site not specified: Secondary | ICD-10-CM

## 2012-04-07 DIAGNOSIS — G510 Bell's palsy: Secondary | ICD-10-CM

## 2012-04-07 DIAGNOSIS — Z23 Encounter for immunization: Secondary | ICD-10-CM

## 2012-04-07 DIAGNOSIS — E119 Type 2 diabetes mellitus without complications: Secondary | ICD-10-CM

## 2012-04-07 LAB — HEMOGLOBIN A1C: Hgb A1c MFr Bld: 7.3 % — ABNORMAL HIGH (ref 4.6–6.5)

## 2012-04-07 MED ORDER — TRIAMCINOLONE ACETONIDE 0.1 % EX CREA: TOPICAL_CREAM | Freq: Two times a day (BID) | CUTANEOUS | Status: DC

## 2012-04-10 ENCOUNTER — Encounter: Payer: Self-pay | Admitting: Family Medicine

## 2012-04-10 NOTE — Progress Notes (Signed)
  Subjective:    Patient ID: Jon Fields, male    DOB: 1926-06-06, 78 y.o.   MRN: [id number]  HPI Here for several issues. First he was seen in the ER on 04-03-12 for drooping on the right side of the face. His head CT and MRI were negative for stroke, and he was diagnosed with Bell's palsy. He has improved since then with only a slight droop. He can now drink and eat without drooling and he can close the right eyelids completely. No HA or other neurologic deficits. He was found to have a UTI and was given Keflex. The culture grew pan sensitive E coli. He also asks about itchy rashes on the lower legs.    Review of Systems  Constitutional: Negative.   HENT: Negative.   Eyes: Negative.   Respiratory: Negative.   Cardiovascular: Negative.   Skin: Positive for rash.  Neurological: Positive for facial asymmetry. Negative for dizziness, tremors, seizures, syncope, speech difficulty, weakness, light-headedness, numbness and headaches.       Objective:   Physical Exam  Constitutional: He is oriented to person, place, and time. He appears well-developed and well-nourished. No distress.  Eyes: Conjunctivae normal and EOM are normal. Pupils are equal, round, and reactive to light.  Neck: Neck supple. No thyromegaly present.  Lymphadenopathy:    He has no cervical adenopathy.  Neurological: He is alert and oriented to person, place, and time.       Very slight droop on the right side of the face when smiling.   Skin:       Red scaly macular patches on both lower legs          Assessment & Plan:  His Bell's palsy is resolving. Use Triamcinolone for the eczema. Finish out the Keflex.

## 2012-04-12 NOTE — Progress Notes (Signed)
Quick Note:  I spoke with pt ______ 

## 2012-05-19 ENCOUNTER — Encounter: Payer: Self-pay | Admitting: Gastroenterology

## 2012-06-21 ENCOUNTER — Ambulatory Visit: Payer: Medicare Other | Admitting: Pulmonary Disease

## 2012-06-26 ENCOUNTER — Other Ambulatory Visit: Payer: Medicare Other

## 2012-06-28 ENCOUNTER — Other Ambulatory Visit: Payer: Self-pay | Admitting: Dermatology

## 2012-06-29 ENCOUNTER — Other Ambulatory Visit (INDEPENDENT_AMBULATORY_CARE_PROVIDER_SITE_OTHER): Payer: Medicare Other

## 2012-06-29 DIAGNOSIS — Z0000 Encounter for general adult medical examination without abnormal findings: Secondary | ICD-10-CM

## 2012-06-29 DIAGNOSIS — E119 Type 2 diabetes mellitus without complications: Secondary | ICD-10-CM

## 2012-06-29 LAB — CBC WITH DIFFERENTIAL/PLATELET
Basophils Relative: 0.4 % (ref 0.0–3.0)
Eosinophils Absolute: 0.3 10*3/uL (ref 0.0–0.7)
Eosinophils Relative: 4.7 % (ref 0.0–5.0)
HCT: 39.9 % (ref 39.0–52.0)
Hemoglobin: 13.5 g/dL (ref 13.0–17.0)
MCHC: 33.9 g/dL (ref 30.0–36.0)
MCV: 91.5 fl (ref 78.0–100.0)
Monocytes Absolute: 0.6 10*3/uL (ref 0.1–1.0)
Neutro Abs: 3.8 10*3/uL (ref 1.4–7.7)
RBC: 4.36 Mil/uL (ref 4.22–5.81)
WBC: 6 10*3/uL (ref 4.5–10.5)

## 2012-06-29 LAB — HEPATIC FUNCTION PANEL
ALT: 27 U/L (ref 0–53)
Albumin: 3.8 g/dL (ref 3.5–5.2)
Bilirubin, Direct: 0.1 mg/dL (ref 0.0–0.3)
Total Protein: 6.6 g/dL (ref 6.0–8.3)

## 2012-06-29 LAB — BASIC METABOLIC PANEL
CO2: 27 mEq/L (ref 19–32)
Chloride: 106 mEq/L (ref 96–112)
Potassium: 4.9 mEq/L (ref 3.5–5.1)
Sodium: 140 mEq/L (ref 135–145)

## 2012-06-29 LAB — LIPID PANEL
HDL: 33 mg/dL — ABNORMAL LOW (ref >39.00)
Triglycerides: 91 mg/dL (ref 0.0–149.0)
VLDL: 18.2 mg/dL (ref 0.0–40.0)

## 2012-06-29 LAB — POCT URINALYSIS DIPSTICK
Bilirubin, UA: NEGATIVE
Glucose, UA: NEGATIVE
Nitrite, UA: POSITIVE
Spec Grav, UA: >=1.03
pH, UA: 5

## 2012-06-29 LAB — PSA: PSA: 0.46 ng/mL (ref 0.10–4.00)

## 2012-06-29 LAB — MICROALBUMIN / CREATININE URINE RATIO
Creatinine,U: 242.7 mg/dL
Microalb Creat Ratio: 2.8 mg/g (ref 0.0–30.0)

## 2012-06-29 LAB — TSH: TSH: 1.79 u[IU]/mL (ref 0.35–5.50)

## 2012-06-30 MED ORDER — CIPROFLOXACIN HCL 500 MG PO TABS: 500.0000 mg | ORAL_TABLET | Freq: Two times a day (BID) | ORAL | Status: DC

## 2012-06-30 NOTE — Addendum Note (Signed)
Addended by: Azucena Freed on: 06/30/2012 01:18 PM   Modules accepted: Orders

## 2012-06-30 NOTE — Progress Notes (Signed)
Quick Note:  Attempted to call pt to advise about medicaiton. Results mailed to pt and medication called in to pharmacy. ______

## 2012-07-05 ENCOUNTER — Encounter: Payer: Self-pay | Admitting: Family Medicine

## 2012-07-05 ENCOUNTER — Ambulatory Visit (INDEPENDENT_AMBULATORY_CARE_PROVIDER_SITE_OTHER): Payer: Medicare Other | Admitting: Family Medicine

## 2012-07-05 VITALS — BP 130/72 | HR 90 | Temp 98.6°F | Ht 70.0 in | Wt 238.0 lb

## 2012-07-05 DIAGNOSIS — Z0000 Encounter for general adult medical examination without abnormal findings: Secondary | ICD-10-CM

## 2012-07-05 MED ORDER — GLIPIZIDE 10 MG PO TABS: 10.0000 mg | ORAL_TABLET | Freq: Two times a day (BID) | ORAL | Status: DC

## 2012-07-05 MED ORDER — LISINOPRIL 20 MG PO TABS: 20.0000 mg | ORAL_TABLET | Freq: Every day | ORAL | Status: DC

## 2012-07-05 MED ORDER — NAPROXEN 500 MG PO TABS: 500.0000 mg | ORAL_TABLET | Freq: Two times a day (BID) | ORAL | Status: DC

## 2012-07-05 NOTE — Progress Notes (Signed)
  Subjective:    Patient ID: Jon Fields, male    DOB: December 02, 1926, 79 y.o.   MRN: [id number]  HPI 79 yr old male for a cpx. He has a few issues to discuss. He catheterizes himself twice a day. No discomfort. We did find a UTI present on his recent labs so he is taking Cipro for this. His insurance recently informed him that they will no longer cover Flomax. He was recently contacted by GI to schedule another colonoscopy. He still has some SOB and is due to see Dr. Shelle Iron soon.    Review of Systems  Constitutional: Negative.   HENT: Negative.   Eyes: Negative.   Respiratory: Negative.   Cardiovascular: Negative.   Gastrointestinal: Negative.   Endocrine: Negative.   Genitourinary: Positive for difficulty urinating. Negative for dysuria, urgency, frequency, hematuria and flank pain.  Musculoskeletal: Negative.   Skin: Negative.   Neurological: Negative.   Hematological: Negative.   Psychiatric/Behavioral: Negative.        Objective:   Physical Exam  Constitutional: He is oriented to person, place, and time. He appears well-developed and well-nourished. No distress.  HENT:  Head: Normocephalic and atraumatic.  Right Ear: External ear normal.  Left Ear: External ear normal.  Nose: Nose normal.  Mouth/Throat: Oropharynx is clear and moist. No oropharyngeal exudate.  Eyes: Conjunctivae and EOM are normal. Pupils are equal, round, and reactive to light. Right eye exhibits no discharge. Left eye exhibits no discharge. No scleral icterus.  Neck: Neck supple. No JVD present. No tracheal deviation present. No thyromegaly present.  Cardiovascular: Normal rate, regular rhythm, normal heart sounds and intact distal pulses.  Exam reveals no gallop and no friction rub.   No murmur heard. EKG shows his usual RBBB and left bifascicular block   Pulmonary/Chest: Effort normal and breath sounds normal. No respiratory distress. He has no wheezes. He has no rales. He exhibits no tenderness.   Abdominal: Soft. Bowel sounds are normal. He exhibits no distension and no mass. There is no tenderness. There is no rebound and no guarding.  Genitourinary: Rectum normal, prostate normal and penis normal. Guaiac negative stool. No penile tenderness.  Musculoskeletal: Normal range of motion. He exhibits no edema and no tenderness.  Lymphadenopathy:    He has no cervical adenopathy.  Neurological: He is alert and oriented to person, place, and time. He has normal reflexes. No cranial nerve deficit. He exhibits normal muscle tone. Coordination normal.  Skin: Skin is warm and dry. No rash noted. He is not diaphoretic. No erythema. No pallor.  Psychiatric: He has a normal mood and affect. His behavior is normal. Judgment and thought content normal.          Assessment & Plan:  Well exam. We will stop the Flomax. Try to get more exercise .

## 2012-07-12 ENCOUNTER — Encounter: Payer: Self-pay | Admitting: Pulmonary Disease

## 2012-07-12 ENCOUNTER — Ambulatory Visit (INDEPENDENT_AMBULATORY_CARE_PROVIDER_SITE_OTHER): Payer: Medicare Other | Admitting: Pulmonary Disease

## 2012-07-12 ENCOUNTER — Ambulatory Visit (INDEPENDENT_AMBULATORY_CARE_PROVIDER_SITE_OTHER)
Admission: RE | Admit: 2012-07-12 | Discharge: 2012-07-12 | Disposition: A | Discharge status: Home / Self Care | Payer: Medicare Other | Source: Ambulatory Visit | Attending: Pulmonary Disease | Admitting: Pulmonary Disease

## 2012-07-12 VITALS — BP 130/80 | HR 87 | Temp 97.4°F | Ht 70.0 in | Wt 242.0 lb

## 2012-07-12 DIAGNOSIS — R0600 Dyspnea, unspecified: Secondary | ICD-10-CM

## 2012-07-12 DIAGNOSIS — R0609 Other forms of dyspnea: Secondary | ICD-10-CM

## 2012-07-12 DIAGNOSIS — R0989 Other specified symptoms and signs involving the circulatory and respiratory systems: Secondary | ICD-10-CM

## 2012-07-12 DIAGNOSIS — J8410 Pulmonary fibrosis, unspecified: Secondary | ICD-10-CM

## 2012-07-12 NOTE — Assessment & Plan Note (Signed)
The patient's fibrosis has remained stable by CT and PFTs.  I do think he needs a yearly chest x-ray to follow.

## 2012-07-12 NOTE — Assessment & Plan Note (Signed)
I suspect the patient's dyspnea on exertion is related to his chronic pulmonary scarring, as well as his obesity and deconditioning.  He saw cardiology last year where an echocardiogram showed normal LV function, possibly diastolic dysfunction, and moderate elevation of his pulmonary artery pressures.  Clinically, he was not felt to have significant coronary disease.  His chest CT and PFTs have been stable, and therefore I think his worsening symptoms are more related to obesity and deconditioning than anything else.  I think he may benefit from pulmonary rehabilitation, and the patient is agreeable.

## 2012-07-12 NOTE — Patient Instructions (Addendum)
Will check chest xray today, and call you with results. Will send a referral to pulmonary rehab program at [facility], and they should be in touch within 2-3 weeks.  If you do not hear from them, please call me. If doing well, followup with me in one year.

## 2012-07-12 NOTE — Progress Notes (Signed)
  Subjective:    Patient ID: Jon Fields, male    DOB: 04-Nov-1926, 79 y.o.   MRN: [id number]  HPI Patient comes in today for followup of his known pulmonary fibrosis related to nitrofurantoin, as well as persistent dyspnea on exertion.  Last year he had a followup CT and PFTs that were stable.  He had an echocardiogram that was not overly impressive, and therefore his worsening dyspnea is probably due to his weight and deconditioning.  The patient denies any significant cough or mucus production.  He has had persistent lower extremity edema.   Review of Systems  Constitutional: Negative for fever and unexpected weight change.  HENT: Positive for rhinorrhea. Negative for ear pain, nosebleeds, congestion, sore throat, sneezing, trouble swallowing, dental problem, postnasal drip and sinus pressure.   Eyes: Negative for redness and itching.  Respiratory: Positive for cough and shortness of breath. Negative for chest tightness and wheezing.   Cardiovascular: Negative for palpitations and leg swelling.  Gastrointestinal: Negative for nausea and vomiting.  Genitourinary: Negative for dysuria.  Musculoskeletal: Negative for joint swelling.  Skin: Negative for rash.  Neurological: Negative for headaches.  Hematological: Does not bruise/bleed easily.  Psychiatric/Behavioral: Negative for dysphoric mood. The patient is not nervous/anxious.        Objective:   Physical Exam Morbidly obese male in no acute distress Nose without purulence or discharge noted Neck without lymphadenopathy or thyromegaly Chest with bibasilar crackles, no wheezing Cardiac exam with regular rate and rhythm, 2/6 systolic murmur Lower extremities with 1+ edema bilaterally, no cyanosis Alert and oriented, moves all 4 extremities.       Assessment & Plan:

## 2012-07-27 ENCOUNTER — Encounter: Payer: Self-pay | Admitting: Gastroenterology

## 2012-08-15 ENCOUNTER — Telehealth: Payer: Self-pay | Admitting: Family Medicine

## 2012-08-15 NOTE — Telephone Encounter (Signed)
Pt called and stated that he felt like it was time to check his a1c. I'd be happy to schedule it, once I receive an order!

## 2012-08-21 NOTE — Telephone Encounter (Signed)
I spoke with pt  

## 2012-08-21 NOTE — Telephone Encounter (Signed)
We just did this on 06-29-12. We should wait 3 months, so we can check it again at the end of May

## 2012-08-25 ENCOUNTER — Ambulatory Visit: Payer: Medicare Other | Admitting: Gastroenterology

## 2012-08-25 ENCOUNTER — Telehealth: Payer: Self-pay | Admitting: Family Medicine

## 2012-08-25 NOTE — Telephone Encounter (Signed)
Refill request for Naproxen, Lisinopril, Glipizide and send to [facility]. Pt wants to do the mail order, not local pharmacy.

## 2012-08-28 NOTE — Telephone Encounter (Signed)
I spoke with pt and he has plenty of refills for now. He will contact [facility] when he needs these scripts.

## 2012-09-18 ENCOUNTER — Ambulatory Visit: Payer: Medicare Other | Admitting: Gastroenterology

## 2012-09-19 ENCOUNTER — Telehealth: Payer: Self-pay | Admitting: Family Medicine

## 2012-09-19 MED ORDER — LISINOPRIL 20 MG PO TABS: 20.0000 mg | ORAL_TABLET | Freq: Every day | ORAL | Status: DC

## 2012-09-19 MED ORDER — GLIPIZIDE 10 MG PO TABS: 10.0000 mg | ORAL_TABLET | Freq: Two times a day (BID) | ORAL | Status: DC

## 2012-09-19 MED ORDER — NAPROXEN 500 MG PO TABS: 500.0000 mg | ORAL_TABLET | Freq: Two times a day (BID) | ORAL | Status: DC

## 2012-09-19 NOTE — Telephone Encounter (Signed)
Pt needs new RX for new pharm. This is first order for this pharm: naproxen (NAPROSYN) 500 MG tablet glipiZIDE (GLUCOTROL) 10 MG tablet lisinopril (PRINIVIL,ZESTRIL) 20 MG tablet New Pharm:  [facility] [phone] (Pt has been trying a week to get this order place)

## 2012-09-19 NOTE — Telephone Encounter (Signed)
I sent scripts e-scribe and left voice message for pt. 

## 2012-09-21 ENCOUNTER — Telehealth: Payer: Self-pay | Admitting: Family Medicine

## 2012-09-21 NOTE — Telephone Encounter (Signed)
Jon Fields,  Patient wants a 90-days supply - he just called. Can you do this?  Also - pt NO LONGER wants to use [facility] for anything. When meds come due for refills, he will let [facility] know. Thanks!

## 2012-09-21 NOTE — Telephone Encounter (Signed)
I called in a 30 day supply of [facility]rosyn to [facility] per pt request and tried to reach pt, no answer.

## 2012-09-21 NOTE — Telephone Encounter (Signed)
I called in a 90 day supply to [facility].

## 2012-09-21 NOTE — Telephone Encounter (Signed)
Jon Fields is telling patient they never received the naproxen (NAPROSYN) 500 MG tablet rx, even though we have receipt confirmation that we did this on 5/20, and they received the other 2 meds we sent. At any rate, pt now really needs the med.  He is requesting we [facility] [address]. Can we do this today? Thanks!!

## 2012-10-09 ENCOUNTER — Encounter: Payer: Self-pay | Admitting: Gastroenterology

## 2012-10-09 ENCOUNTER — Ambulatory Visit: Payer: Medicare Other | Admitting: Gastroenterology

## 2012-10-09 ENCOUNTER — Ambulatory Visit (INDEPENDENT_AMBULATORY_CARE_PROVIDER_SITE_OTHER): Payer: Medicare Other | Admitting: Gastroenterology

## 2012-10-09 VITALS — BP 124/72 | HR 60 | Ht 70.0 in | Wt 242.0 lb

## 2012-10-09 DIAGNOSIS — Z1211 Encounter for screening for malignant neoplasm of colon: Secondary | ICD-10-CM

## 2012-10-09 NOTE — Progress Notes (Signed)
HPI: This is a   very pleasant 79 year old man whom I am meeting for the first time today.  He had a colonoscopy with Dr. Corinda Gubler about 10 years ago and was told to come back at 10 year interval   He has pulmonary fibrosis, baseline DOE from that + deconditioned state.  Saw pulmonologist earlier this year.  LAbs 3 months ago shows he is not anemic, and IS normocytic.  No GI bleeding, no significant bowel changes.  Some minor constipation a while back was treated effectively with stool softners.  Stable weight.  No colon cancer.  His balance is poor, he avoids walking much.   Review of systems: Pertinent positive and negative review of systems were noted in the above HPI section. Complete review of systems was performed and was otherwise normal.    Past Medical History  Diagnosis Date  . Sleep apnea, obstructive     sees Dr. Marcelyn Bruins, had sleep study 12-27-07  . Pulmonary fibrosis     sees Dr. Shelle Iron   . Hypothyroidism   . Diabetes mellitus     type 2  . Depression   . Hyperlipidemia   . Hypertension   . Vitamin D deficiency   . Erectile dysfunction   . PVD (peripheral vascular disease)   . Osteoarthritis, generalized   . Arthritis   . Unsteady gait   . Vertebral compression fracture   . BPH with urinary obstruction     sees Dr. Patsi Sears  . Diabetes mellitus type II, uncontrolled   . Bell's palsy     2013    Past Surgical History  Procedure Laterality Date  . Cholecystectomy    . Appendectomy    . Transurethral resection of prostate  May 2012    per Dr. Patsi Sears  . Colonoscopy  06-12-02    per Dr. Victorino Dike, diverticulosis only, repeat in 10  yrs     Current Outpatient Prescriptions  Medication Sig Dispense Refill  . aspirin 81 MG tablet Take 81 mg by mouth 3 (three) times a week. Mon, Wed Fri      . Blood Glucose Calibration (ACCU-CHEK AVIVA) SOLN by In Vitro route.        [name] glipiZIDE (GLUCOTROL) 10 MG tablet Take 1 tablet (10 mg total) by mouth 2  (two) times daily before a meal.  180 tablet  2  . glucose blood (ACCU-CHEK AVIVA PLUS) test strip Use as directed.   Dx code: 250.00  100 each  11  . Lancets (ACCU-CHEK MULTICLIX) lancets Use as instructed  102 each  11  . Lancets MISC Apply 1 application topically daily.  100 each  3  . lisinopril (PRINIVIL,ZESTRIL) 20 MG tablet Take 1 tablet (20 mg total) by mouth daily.  90 tablet  2  . naproxen (NAPROSYN) 500 MG tablet Take 1 tablet (500 mg total) by mouth 2 (two) times daily with a meal.  180 tablet  2  . triamcinolone cream (KENALOG) 0.1 % Apply topically 2 (two) times daily.  45 g  5   No current facility-administered medications for this visit.    Allergies as of 10/09/2012  . (No Known Allergies)    Family History  Problem Relation Age of Onset  . Pancreatic cancer Sister   . Colon cancer Neg Hx     History   Social History  . Marital Status: Widowed    Spouse Name: N/A    Number of Children: N/A  . Years of Education: N/A  Occupational History  . Not on file.   Social History Main Topics  . Smoking status: Never Smoker   . Smokeless tobacco: Never Used  . Alcohol Use: No  . Drug Use: No  . Sexually Active: Not on file   Other Topics Concern  . Not on file   Social History Narrative  . No narrative on file       Physical Exam: BP 124/72  Pulse 60  Ht 5\' 10"  (1.778 m)  Wt 242 lb (109.77 kg)  BMI 34.72 kg/m2 Constitutional: generally well-appearing Psychiatric: alert and oriented x3 Eyes: extraocular movements intact Mouth: oral pharynx moist, no lesions Neck: supple no lymphadenopathy Cardiovascular: heart regular rate and rhythm Lungs: clear to auscultation bilaterally Abdomen: soft, nontender, nondistended, no obvious ascites, no peritoneal signs, normal bowel sounds Extremities: no lower extremity edema bilaterally Skin: no lesions on visible extremities    Assessment and plan: 79 y.o. male with  routine risk for colon cancer  He  is 79 years old has mild baseline dyspnea on exertion. He has some gait disturbances, balance problems. He is not anemic and has had no specific GI complaints.  I do not think that colon cancer screening is very relevant clinical question for him anymore. He does understand that this does not mean that we will ignore any overt GI symptoms if they occur. He will call if he has further troubles or concerns.

## 2012-10-09 NOTE — Patient Instructions (Addendum)
Colon cancer screening usually stops between age 77-77 for most people. IF you have significant bowel changes (bleeding, diarrhea etc) then please call.                                               We are excited to introduce MyChart, a new best-in-class service that provides you online access to important information in your electronic medical record. We want to make it easier for you to view your health information - all in one secure location - when and where you need it. We expect MyChart will enhance the quality of care and service we provide.  When you register for MyChart, you can:    View your test results.    Request appointments and receive appointment reminders via email.    Request medication renewals.    View your medical history, allergies, medications and immunizations.    Communicate with your physician's office through a password-protected site.    Conveniently print information such as your medication lists.  To find out if MyChart is right for you, please talk to a member of our clinical staff today. We will gladly answer your questions about this free health and wellness tool.  If you are age 77 or older and want a member of your family to have access to your record, you must provide written consent by completing a proxy form available at our office. Please speak to our clinical staff about guidelines regarding accounts for patients younger than age 9.  As you activate your MyChart account and need any technical assistance, please call the MyChart technical support line at [phone]-[phone]) or email your question to [email or web address]. If you email your question(s), please include your name, a return phone number and the best time to reach you.  If you have non-urgent health-related questions, you can send a message to our office through MyChart at [email or web address]. If you have a medical emergency, call 911.  Thank you for using MyChart as  your new health and wellness resource!   MyChart licensed from [employer],  [id number]. Patents Pending.

## 2012-11-10 ENCOUNTER — Telehealth: Payer: Self-pay | Admitting: Pulmonary Disease

## 2012-11-10 NOTE — Telephone Encounter (Signed)
Called and spoke with pt and he wanted to see if [facility] thought that he would benefit from pulmonary rehab.  Per last ov note from 07/2012 with [facility]----[facility] felt that the pt would benefit from the pulmonary rehab and the pt agreed.  Pt did not want me to set this up for him, he stated that he could do that himself.  Nothing further is needed.

## 2012-12-25 ENCOUNTER — Other Ambulatory Visit: Payer: Self-pay

## 2012-12-25 MED ORDER — NAPROXEN 500 MG PO TABS: 500.0000 mg | ORAL_TABLET | Freq: Two times a day (BID) | ORAL | Status: DC

## 2012-12-25 NOTE — Telephone Encounter (Signed)
Rx request for naproxen 500 mg; rx lasted filled 09/19/12 #180 x 2 refills; ok per Dr. Clent Ridges to fill for 1 yr. Rx sent to pharmacy.

## 2013-01-02 ENCOUNTER — Ambulatory Visit (INDEPENDENT_AMBULATORY_CARE_PROVIDER_SITE_OTHER): Payer: Medicare Other | Admitting: Family Medicine

## 2013-01-02 ENCOUNTER — Other Ambulatory Visit: Payer: Self-pay | Admitting: *Deleted

## 2013-01-02 ENCOUNTER — Encounter: Payer: Self-pay | Admitting: Family Medicine

## 2013-01-02 VITALS — BP 130/74 | HR 85 | Temp 98.0°F | Wt 241.0 lb

## 2013-01-02 DIAGNOSIS — E559 Vitamin D deficiency, unspecified: Secondary | ICD-10-CM

## 2013-01-02 DIAGNOSIS — I10 Essential (primary) hypertension: Secondary | ICD-10-CM

## 2013-01-02 DIAGNOSIS — E119 Type 2 diabetes mellitus without complications: Secondary | ICD-10-CM

## 2013-01-02 LAB — POCT URINALYSIS DIPSTICK
Glucose, UA: NEGATIVE
Nitrite, UA: NEGATIVE
Urobilinogen, UA: 0.2
pH, UA: 5

## 2013-01-02 LAB — HEMOGLOBIN A1C: Hgb A1c MFr Bld: 7.9 % — ABNORMAL HIGH (ref 4.6–6.5)

## 2013-01-02 MED ORDER — METFORMIN HCL 500 MG PO TABS: 500.0000 mg | ORAL_TABLET | Freq: Two times a day (BID) | ORAL | Status: DC

## 2013-01-02 MED ORDER — VITAMIN D (ERGOCALCIFEROL) 1.25 MG (50000 UNIT) PO CAPS: 50000.0000 [IU] | ORAL_CAPSULE | ORAL | Status: DC

## 2013-01-02 NOTE — Progress Notes (Signed)
  Subjective:    Patient ID: Jon Fields, male    DOB: 09-Feb-1927, 79 y.o.   MRN: [id number]  HPI Here to discuss some issues. He has felt more tired than usual for several months. His glucoses have slowly increased, and he often gets in the range of 130 to 180 now. He watches his diet but cannot exercise much. He applied for a research study at [facility] for low vitamin D, but he was disqualified by his diabetes. He had labs done there showing a normal metabolic panel but a vit. D level of 15.6.    Review of Systems  Constitutional: Positive for fatigue. Negative for unexpected weight change.  Respiratory: Positive for shortness of breath. Negative for choking, chest tightness and wheezing.   Cardiovascular: Negative.        Objective:   Physical Exam  Constitutional: He appears well-developed and well-nourished.  Walks slowly with a cane   Cardiovascular: Normal rate, regular rhythm, normal heart sounds and intact distal pulses.   Pulmonary/Chest: Effort normal and breath sounds normal.  Musculoskeletal: He exhibits no edema.          Assessment & Plan:  We will start him on Metformin 500 mg bid. Get an A1c today. We will plan on seeing him on a regular basis from now on at 90 day intervals. Start back on ergocalciferol.

## 2013-01-03 NOTE — Telephone Encounter (Signed)
error 

## 2013-01-03 NOTE — Progress Notes (Signed)
Quick Note:  I spoke with pt ______ 

## 2013-01-04 ENCOUNTER — Telehealth: Payer: Self-pay | Admitting: Family Medicine

## 2013-01-04 ENCOUNTER — Ambulatory Visit (INDEPENDENT_AMBULATORY_CARE_PROVIDER_SITE_OTHER): Payer: Medicare Other | Admitting: Family Medicine

## 2013-01-04 ENCOUNTER — Encounter: Payer: Self-pay | Admitting: Family Medicine

## 2013-01-04 VITALS — BP 132/60 | HR 76 | Temp 98.0°F | Wt 241.0 lb

## 2013-01-04 DIAGNOSIS — Z23 Encounter for immunization: Secondary | ICD-10-CM

## 2013-01-04 DIAGNOSIS — M5416 Radiculopathy, lumbar region: Secondary | ICD-10-CM

## 2013-01-04 DIAGNOSIS — IMO0002 Reserved for concepts with insufficient information to code with codable children: Secondary | ICD-10-CM

## 2013-01-04 MED ORDER — TRAMADOL HCL 50 MG PO TABS: ORAL_TABLET | ORAL | Status: DC

## 2013-01-04 NOTE — Patient Instructions (Addendum)

## 2013-01-04 NOTE — Telephone Encounter (Signed)
Patient Information:  Caller Name: Elman  Phone: [phone]  Patient: Jon Fields, Jon Fields  Gender: Male  DOB: [birth year]  Age: 79 Years  PCP: [facility]  Office Follow Up:  Does the office need to follow up with this patient?: No  Instructions For The Office: N/A  RN Note:  MD instructed patient at office visit 01/02/13 that the back and leg pain might be from a pinched nerve but no imaging or pain medication ordered. Low back pain currently rated 7/10 and right leg pain 6/10.  FBS 184 at 0530. Pain started suddenly on 01/02/13; initially pain was "intense" rated 9/10. Agreed to go to [facility] now for evaluation.  Symptoms  Reason For Call & Symptoms: Emergent Call:  Reports low back pain radiating down right leg, unable to sleep. Pain is intermittent.  Seen 01/02/13; MD said it might be a pinched nerve.  [facility] staff was supposed to contact MD to see if needs appointment or medication.  Reviewed Health History In EMR: Yes  Reviewed Medications In EMR: Yes  Reviewed Allergies In EMR: Yes  Reviewed Surgeries / Procedures: Yes  Date of Onset of Symptoms: 01/02/2013  Guideline(s) Used:  Back Pain  Disposition Per Guideline:   Go to ED Now (or to Office with PCP Approval)  Reason For Disposition Reached:   Weakness of a leg or foot (e.g., unable to bear weight, dragging foot)  Advice Given:  N/A  RN Overrode Recommendation:  Go To ED  [facility]

## 2013-01-04 NOTE — Progress Notes (Signed)
Subjective:    Patient ID: Jon Fields, male    DOB: 1927-03-10, 79 y.o.   MRN: [id number]  HPI  Acute visit. Patient initially complaining of right hip pain. On further questioning patient actually has more right buttock pain with radiation toward the right knee.  he's also noticed some right lumbar back pain past couple days. Denies any recent fall or injury. He first noticed some increased pain "Sunday night. Advil initially helped. By Tuesday his pain worsened and Advil and heat did not help. His pain is sharp. He has some chronic numbness right thigh which is unchanged. Denies any urine or stool incontinence. No definite weakness.  He has type 2 diabetes with recent A1c 7.9%. No history of peripheral neuropathy related to that. Current pain severity is 8/10 at its worst. Pain is at rest also with changing positions. No fevers or chills. No dysuria Patient denies any recent appetite or weight changes.  Past Medical History  Diagnosis Date  . Sleep apnea, obstructive     sees Dr. Keith Clance, had sleep study 12-27-07  . Pulmonary fibrosis     sees Dr. Clance   . Hypothyroidism   . Diabetes mellitus     type 2  . Depression   . Hyperlipidemia   . Hypertension   . Vitamin D deficiency   . Erectile dysfunction   . PVD (peripheral vascular disease)   . Osteoarthritis, generalized   . Arthritis   . Unsteady gait   . Vertebral compression fracture   . BPH with urinary obstruction     sees Dr. Tannenbaum  . Diabetes mellitus type II, uncontrolled   . Bell's palsy     20" 13   Past Surgical History  Procedure Laterality Date  . Cholecystectomy    . Appendectomy    . Transurethral resection of prostate  May 2012    per Dr. Patsi Sears  . Colonoscopy  06-12-02    per Dr. Victorino Dike, diverticulosis only, repeat in 10  yrs     reports that he has never smoked. He has never used smokeless tobacco. He reports that he does not drink alcohol or use illicit drugs. family history  includes Pancreatic cancer in his sister. There is no history of Colon cancer. No Known Allergies   Review of Systems  Constitutional: Negative for fever, chills, activity change and appetite change.  Respiratory: Negative for cough and shortness of breath.   Cardiovascular: Negative for chest pain and leg swelling.  Gastrointestinal: Negative for vomiting and abdominal pain.  Endocrine: Negative for polydipsia and polyuria.  Genitourinary: Negative for dysuria, hematuria and flank pain.  Musculoskeletal: Positive for back pain. Negative for joint swelling.  Skin: Negative for rash.  Neurological: Positive for numbness. Negative for weakness.       Objective:   Physical Exam  Constitutional: He appears well-developed and well-nourished.  Cardiovascular: Normal rate.   Pulmonary/Chest: Effort normal and breath sounds normal. No respiratory distress. He has no wheezes. He has no rales.  Musculoskeletal: He exhibits no edema.  Right hip reveals fair range of motion. He does not have any localized tenderness in the lateral right hip region. Minimal tenderness right lower lumbar region. Good distal foot pulses on the right  Neurological:  Full-strength lower extremities. Deep tendon reflexes symmetric          Assessment & Plan:  Right lumbar back pain with radiculopathy symptoms. Nonfocal neuro exam. Tramadol 50 mg one or 2 every 6 hours as needed for  pain. Avoid prednisone with poorly controlled diabetes. May need imaging if symptoms not improving over the next 2-3 weeks. Followup sooner for any weakness or progressive pain  Health maintenance. Patient requesting flu vaccine. This is given.

## 2013-01-10 ENCOUNTER — Telehealth: Payer: Self-pay | Admitting: Family Medicine

## 2013-01-10 NOTE — Telephone Encounter (Signed)
Refill request for Naproxen 500 mg take 1 po bid and send to [facility].

## 2013-01-11 MED ORDER — NAPROXEN 500 MG PO TABS: 500.0000 mg | ORAL_TABLET | Freq: Two times a day (BID) | ORAL | Status: DC

## 2013-01-11 NOTE — Telephone Encounter (Signed)
Okay for a year 

## 2013-01-11 NOTE — Telephone Encounter (Signed)
Rx sent 

## 2013-01-15 ENCOUNTER — Encounter (HOSPITAL_BASED_OUTPATIENT_CLINIC_OR_DEPARTMENT_OTHER): Payer: Self-pay | Admitting: *Deleted

## 2013-01-15 ENCOUNTER — Emergency Department (HOSPITAL_BASED_OUTPATIENT_CLINIC_OR_DEPARTMENT_OTHER): Payer: Medicare Other

## 2013-01-15 ENCOUNTER — Emergency Department (HOSPITAL_BASED_OUTPATIENT_CLINIC_OR_DEPARTMENT_OTHER)
Admission: EM | Admit: 2013-01-15 | Discharge: 2013-01-15 | Disposition: A | Discharge status: Home / Self Care | Payer: Medicare Other | Attending: Emergency Medicine | Admitting: Emergency Medicine

## 2013-01-15 ENCOUNTER — Telehealth: Payer: Self-pay | Admitting: Family Medicine

## 2013-01-15 DIAGNOSIS — R5381 Other malaise: Secondary | ICD-10-CM | POA: Insufficient documentation

## 2013-01-15 DIAGNOSIS — F3289 Other specified depressive episodes: Secondary | ICD-10-CM | POA: Insufficient documentation

## 2013-01-15 DIAGNOSIS — Z7982 Long term (current) use of aspirin: Secondary | ICD-10-CM | POA: Insufficient documentation

## 2013-01-15 DIAGNOSIS — M25559 Pain in unspecified hip: Secondary | ICD-10-CM | POA: Insufficient documentation

## 2013-01-15 DIAGNOSIS — Z8669 Personal history of other diseases of the nervous system and sense organs: Secondary | ICD-10-CM | POA: Insufficient documentation

## 2013-01-15 DIAGNOSIS — Z8781 Personal history of (healed) traumatic fracture: Secondary | ICD-10-CM | POA: Insufficient documentation

## 2013-01-15 DIAGNOSIS — IMO0001 Reserved for inherently not codable concepts without codable children: Secondary | ICD-10-CM | POA: Insufficient documentation

## 2013-01-15 DIAGNOSIS — M1990 Unspecified osteoarthritis, unspecified site: Secondary | ICD-10-CM | POA: Insufficient documentation

## 2013-01-15 DIAGNOSIS — Z791 Long term (current) use of non-steroidal anti-inflammatories (NSAID): Secondary | ICD-10-CM | POA: Insufficient documentation

## 2013-01-15 DIAGNOSIS — M129 Arthropathy, unspecified: Secondary | ICD-10-CM | POA: Insufficient documentation

## 2013-01-15 DIAGNOSIS — Z8709 Personal history of other diseases of the respiratory system: Secondary | ICD-10-CM | POA: Insufficient documentation

## 2013-01-15 DIAGNOSIS — N390 Urinary tract infection, site not specified: Secondary | ICD-10-CM

## 2013-01-15 DIAGNOSIS — K5900 Constipation, unspecified: Secondary | ICD-10-CM | POA: Insufficient documentation

## 2013-01-15 DIAGNOSIS — Z79899 Other long term (current) drug therapy: Secondary | ICD-10-CM | POA: Insufficient documentation

## 2013-01-15 DIAGNOSIS — F329 Major depressive disorder, single episode, unspecified: Secondary | ICD-10-CM | POA: Insufficient documentation

## 2013-01-15 DIAGNOSIS — Z8679 Personal history of other diseases of the circulatory system: Secondary | ICD-10-CM | POA: Insufficient documentation

## 2013-01-15 DIAGNOSIS — E559 Vitamin D deficiency, unspecified: Secondary | ICD-10-CM | POA: Insufficient documentation

## 2013-01-15 DIAGNOSIS — I10 Essential (primary) hypertension: Secondary | ICD-10-CM | POA: Insufficient documentation

## 2013-01-15 LAB — CBC WITH DIFFERENTIAL/PLATELET
Basophils Absolute: 0 10*3/uL (ref 0.0–0.1)
Eosinophils Absolute: 0.3 10*3/uL (ref 0.0–0.7)
Eosinophils Relative: 4 % (ref 0–5)
MCH: 30.9 pg (ref 26.0–34.0)
MCV: 91.1 fL (ref 78.0–100.0)
Platelets: 177 10*3/uL (ref 150–400)
RDW: 12.8 % (ref 11.5–15.5)

## 2013-01-15 LAB — URINALYSIS, ROUTINE W REFLEX MICROSCOPIC
Bilirubin Urine: NEGATIVE
Nitrite: POSITIVE — AB
Specific Gravity, Urine: 1.019 (ref 1.005–1.030)
pH: 6 (ref 5.0–8.0)

## 2013-01-15 LAB — COMPREHENSIVE METABOLIC PANEL
ALT: 20 U/L (ref 0–53)
AST: 18 U/L (ref 0–37)
Calcium: 9.8 mg/dL (ref 8.4–10.5)
GFR calc Af Amer: 89 mL/min — ABNORMAL LOW (ref >90)
Glucose, Bld: 117 mg/dL — ABNORMAL HIGH (ref 70–99)
Sodium: 140 mEq/L (ref 135–145)
Total Protein: 7.2 g/dL (ref 6.0–8.3)

## 2013-01-15 LAB — URINE MICROSCOPIC-ADD ON

## 2013-01-15 MED ORDER — CIPROFLOXACIN HCL 500 MG PO TABS: 500.0000 mg | ORAL_TABLET | Freq: Two times a day (BID) | ORAL | Status: DC

## 2013-01-15 MED ORDER — CIPROFLOXACIN HCL 500 MG PO TABS
500.0000 mg | ORAL_TABLET | Freq: Once | ORAL | Status: AC
  Administered 2013-01-15: 500 mg via ORAL
  Filled 2013-01-15: qty 1

## 2013-01-15 NOTE — ED Provider Notes (Signed)
CSN: [id number]     Arrival date & time 01/15/13  1738 History  This chart was scribed for Gwyneth Sprout, MD by Ardelia Mems and Joaquin Music, ED Scribes. This patient was seen in room MH12/MH12 and the patient's care was started at 6:00 PM.  Chief Complaint  Patient presents with  . Back Pain    The history is provided by the patient. No language interpreter was used.   HPI Comments: Jon Fields is a 79 y.o. male who presents to the Emergency Department complaining of constant, moderate, right posterior hip pain that radiates to his right knee and right lower leg. He states that he has been seen for this about 2 weeks ago and has taken prescribed pain medication with relief of pain. He states that he ambulates with a walker, has poor balance at baseline, and that he has had increased fatigue recently. He denies recent falls, history of surgeries or history of injections. He states that he has a history of DM which is well-controlled. He states that he lives alone and he self-catheterizes himself twice daily. He states that his urine has been cloudy recently. He states that he has some constipation at baseline with no recent changes.    Past Medical History  Diagnosis Date  . Sleep apnea, obstructive     sees Dr. Marcelyn Bruins, had sleep study 12-27-07  . Pulmonary fibrosis     sees Dr. Shelle Iron   . Hypothyroidism   . Diabetes mellitus     type 2  . Depression   . Hyperlipidemia   . Hypertension   . Vitamin D deficiency   . Erectile dysfunction   . PVD (peripheral vascular disease)   . Osteoarthritis, generalized   . Arthritis   . Unsteady gait   . Vertebral compression fracture   . BPH with urinary obstruction     sees Dr. Patsi Sears  . Diabetes mellitus type II, uncontrolled   . Bell's palsy     2013   Past Surgical History  Procedure Laterality Date  . Cholecystectomy    . Appendectomy    . Transurethral resection of prostate  May 2012    per Dr.  Patsi Sears  . Colonoscopy  06-12-02    per Dr. Victorino Dike, diverticulosis only, repeat in 10  yrs    Family History  Problem Relation Age of Onset  . Pancreatic cancer Sister   . Colon cancer Neg Hx    History  Substance Use Topics  . Smoking status: Never Smoker   . Smokeless tobacco: Never Used  . Alcohol Use: No    Review of Systems  Constitutional: Positive for fatigue.  Musculoskeletal:       Right hip pain.  All other systems reviewed and are negative.    Allergies  Review of patient's allergies indicates no known allergies.  Home Medications   Current Outpatient Rx  Name  Route  Sig  Dispense  Refill  . aspirin 81 MG tablet   Oral   Take 81 mg by mouth 3 (three) times a week. Mon, Wed Fri         . Blood Glucose Calibration (ACCU-CHEK AVIVA) SOLN   In Vitro   by In Vitro route.           [name] glipiZIDE (GLUCOTROL) 10 MG tablet   Oral   Take 1 tablet (10 mg total) by mouth 2 (two) times daily before a meal.   180 tablet   2   .  glucose blood (ACCU-CHEK AVIVA PLUS) test strip      Use as directed.   Dx code: 250.00   100 each   11   . Lancets (ACCU-CHEK MULTICLIX) lancets      Use as instructed   102 each   11   . Lancets MISC   Topical   Apply 1 application topically daily.   100 each   3   . lisinopril (PRINIVIL,ZESTRIL) 20 MG tablet   Oral   Take 1 tablet (20 mg total) by mouth daily.   90 tablet   2   . metFORMIN (GLUCOPHAGE) 500 MG tablet   Oral   Take 1 tablet (500 mg total) by mouth 2 (two) times daily with a meal.   180 tablet   3   . naproxen (NAPROSYN) 500 MG tablet   Oral   Take 1 tablet (500 mg total) by mouth 2 (two) times daily with a meal.   180 tablet   3   . traMADol (ULTRAM) 50 MG tablet      1-2 tablets po q 6 hours prn pain   60 tablet   1   . triamcinolone cream (KENALOG) 0.1 %   Topical   Apply topically 2 (two) times daily.   45 g   5   . Vitamin D, Ergocalciferol, (DRISDOL) 50000 UNITS CAPS  capsule   Oral   Take 1 capsule (50,000 Units total) by mouth every 7 (seven) days.   12 capsule   3    Triage Vitals: BP 180/73  Pulse 77  Temp(Src) 97.9 F (36.6 C) (Oral)  Resp 18  Ht 5\' 10"  (1.778 m)  Wt 240 lb (108.863 kg)  BMI 34.44 kg/m2  SpO2 98%  Physical Exam  Nursing note and vitals reviewed. Constitutional: He is oriented to person, place, and time. He appears well-developed and well-nourished. No distress.  HENT:  Head: Normocephalic and atraumatic.  Eyes: EOM are normal. Pupils are equal, round, and reactive to light.  Neck: Neck supple. No tracheal deviation present.  Cardiovascular: Normal rate and intact distal pulses.  Exam reveals no gallop and no friction rub.   No murmur heard. 2+ DP and PT pulses biltaterally.  Pulmonary/Chest: Effort normal. No respiratory distress. He has no wheezes. He has no rales.  Abdominal: Soft. He exhibits no distension. There is no tenderness. There is no rebound and no guarding.  Musculoskeletal: Normal range of motion.  right paralumbar muscle tenderness. Full ROM without hip pain.  Neurological: He is alert and oriented to person, place, and time.  Normal sensation and strength in bilateral legs.  Skin: Skin is warm and dry.  Psychiatric: He has a normal mood and affect. His behavior is normal.    ED Course  Procedures  DIAGNOSTIC STUDIES: Oxygen Saturation is 98% on RA, normal by my interpretation.    COORDINATION OF CARE: 6:10 PM-Discussed treatment plan UA and other lab work, along with an X-ray of pt's spine. Pt agreed with plan for treatment.  Labs Review Labs Reviewed  CBC WITH DIFFERENTIAL - Abnormal; Notable for the following:    HCT 38.9 (*)    All other components within normal limits  COMPREHENSIVE METABOLIC PANEL - Abnormal; Notable for the following:    Glucose, Bld 117 (*)    GFR calc non Af Amer 77 (*)    GFR calc Af Amer 89 (*)    All other components within normal limits  URINALYSIS, ROUTINE W  REFLEX MICROSCOPIC -  Abnormal; Notable for the following:    APPearance TURBID (*)    Glucose, UA 100 (*)    Hgb urine dipstick MODERATE (*)    Protein, ur 100 (*)    Nitrite POSITIVE (*)    Leukocytes, UA LARGE (*)    All other components within normal limits  URINE MICROSCOPIC-ADD ON - Abnormal; Notable for the following:    Bacteria, UA MANY (*)    All other components within normal limits  URINE CULTURE   Imaging Review Dg Lumbar Spine Complete  01/15/2013   CLINICAL DATA:  Low back pain.  EXAM: LUMBAR SPINE - COMPLETE 4+ VIEW  COMPARISON:  No priors.  FINDINGS: Five views of the lumbar spine demonstrate no definite acute displaced fractures or compression type fractures. Alignment is anatomic. Multilevel degenerative disc disease and facet arthropathy. Prominent anterior osteophytes are noted at L2-L3 and L3-L4. No defects of the pars interarticularis. Probable ankylosis throughout the visualized lower thoracic spine.  IMPRESSION: 1. No acute radiographic abnormality of the lumbar spine. 2. Multilevel degenerative disc disease and lumbar spondylosis, as above.   Electronically Signed   By: Trudie Reed M.D.   On: 01/15/2013 19:19    MDM   1. UTI (lower urinary tract infection)     Patient here initially complaining of right-sided back pain that sounds most likely related to sciatica for the last 2-3 weeks which he is taking pain medication for which she says BUT comes today at because he has been generally more tired and weak than normal and he self caths and states his urine has been more cloudy. He denies nausea, vomiting, abdominal pain, fever. He is well appearing on exam without any signs of pathology to the hip or knee. No sign of cellulitis or infection. Neurovascularly intact.  Concern for possible UTI exacerbating his symptoms at this time. CBC, CMP, UA, lumbar film pending. Patient denies any recent falls or trauma 7:17 PM Last urine culture in dec pansensitive.  Will  start on cipro for UTI.  I personally performed the services described in this documentation, which was scribed in my presence.  The recorded information has been reviewed and considered.    Gwyneth Sprout, MD 01/15/13 1930

## 2013-01-15 NOTE — Telephone Encounter (Signed)
ED Notification 

## 2013-01-15 NOTE — ED Notes (Signed)
Patient states he has a two to three week history of right back pain.  States he was seen by his PCP and told he had a pinched nerve.

## 2013-01-15 NOTE — Telephone Encounter (Signed)
Patient Information:  Caller Name: [place]  Phone: [phone]  Patient: Jon Fields, Jon Fields  Gender: Male  DOB: [birth year]  Age: 79 Years  PCP: Gershon Crane [facility])  Office Follow Up:  Does the office need to follow up with this patient?: No  Instructions For The Office: N/A  RN Note:  Weakness has progressed from one leg to both. Agreed to be seen at [facility] ED.  Symptoms  Reason For Call & Symptoms: Constant lower back pain for past two weeks.  Both legs are very weak. Been seen twice for back pain. Dr Clent Ridges said may need MRI.  FBS not tested yet today; was 110 at 0800 01/14/13. Back pain currently rated 2-3/10.  Reviewed Health History In EMR: Yes  Reviewed Medications In EMR: Yes  Reviewed Allergies In EMR: Yes  Reviewed Surgeries / Procedures: Yes  Date of Onset of Symptoms: 01/01/2013  Treatments Tried: Tramadol  Treatments Tried Worked: Yes  Guideline(s) Used:  Back Pain  Disposition Per Guideline:   Go to ED Now (or to Office with PCP Approval)  Reason For Disposition Reached:   Weakness of a leg or foot (e.g., unable to bear weight, dragging foot)  Advice Given:  N/A  RN Overrode Recommendation:  Go To ED  [facility]

## 2013-01-16 ENCOUNTER — Telehealth: Payer: Self-pay | Admitting: Family Medicine

## 2013-01-18 ENCOUNTER — Ambulatory Visit (INDEPENDENT_AMBULATORY_CARE_PROVIDER_SITE_OTHER)
Admission: RE | Admit: 2013-01-18 | Discharge: 2013-01-18 | Disposition: A | Discharge status: Home / Self Care | Payer: Medicare Other | Source: Ambulatory Visit | Attending: Family Medicine | Admitting: Family Medicine

## 2013-01-18 ENCOUNTER — Encounter: Payer: Self-pay | Admitting: Family Medicine

## 2013-01-18 ENCOUNTER — Ambulatory Visit (INDEPENDENT_AMBULATORY_CARE_PROVIDER_SITE_OTHER): Payer: Medicare Other | Admitting: Family Medicine

## 2013-01-18 VITALS — BP 138/70 | HR 69 | Temp 98.3°F

## 2013-01-18 DIAGNOSIS — M25571 Pain in right ankle and joints of right foot: Secondary | ICD-10-CM

## 2013-01-18 DIAGNOSIS — M25551 Pain in right hip: Secondary | ICD-10-CM

## 2013-01-18 DIAGNOSIS — M25579 Pain in unspecified ankle and joints of unspecified foot: Secondary | ICD-10-CM

## 2013-01-18 DIAGNOSIS — M545 Low back pain: Secondary | ICD-10-CM

## 2013-01-18 DIAGNOSIS — M25559 Pain in unspecified hip: Secondary | ICD-10-CM

## 2013-01-18 DIAGNOSIS — M25569 Pain in unspecified knee: Secondary | ICD-10-CM

## 2013-01-18 DIAGNOSIS — M25561 Pain in right knee: Secondary | ICD-10-CM

## 2013-01-18 MED ORDER — TRAMADOL HCL 50 MG PO TABS: 100.0000 mg | ORAL_TABLET | Freq: Four times a day (QID) | ORAL | Status: DC | PRN

## 2013-01-18 NOTE — Progress Notes (Signed)
  Subjective:    Patient ID: Jon Fields, male    DOB: 07/27/1926, 78 y.o.   MRN: [id number]  HPI Here for continued sharp pains in the right lower back, the right hip, the right knee , and the right ankle. This has been going on for several months but is getting worse. He now has weakness down the right leg also. No numbness. He used to walk with a cane but now has to use a wheelchair to get around. He thinks this may be related to a fall he took last February when the chair he was sitting in collapsed, causing him to land quite hard on the floor. He recently saw Dr. Caryl Never who gave him some Tramadol, and this helped quite a bit. He had a plain film of the LS spine showing diffuse degenerative changes and some spurs, but no acute fractures.    Review of Systems  Constitutional: Negative.   Musculoskeletal: Positive for back pain, arthralgias and gait problem.       Objective:   Physical Exam  Constitutional: He appears well-developed and well-nourished.  In a wheelchair   Musculoskeletal:  Tender over the right lower back and sciatic notch. The hip appears normal. The knee shows crepitus and joint space tenderness. The ankle appears normal           Assessment & Plan:  Given tramadol for the pain. He seems to have some sciatic pain but he probably has arthritic pain as well. Get Xrays of the hip and knee and ankle, as well as a lumbar MRI.

## 2013-01-19 NOTE — Progress Notes (Signed)
Quick Note:  I spoke with pt ______ 

## 2013-01-20 LAB — URINE CULTURE

## 2013-01-21 ENCOUNTER — Telehealth (HOSPITAL_COMMUNITY): Payer: Self-pay | Admitting: Emergency Medicine

## 2013-01-21 NOTE — ED Notes (Signed)
Post ED Visit - Positive Culture Follow-up  Culture report reviewed by antimicrobial stewardship pharmacist: [x]  Wes Dulaney, Pharm.D., BCPS []  Celedonio Miyamoto, Pharm.D., BCPS []  Georgina Pillion, Pharm.D., BCPS []  [place], [address].D., BCPS, AAHIVP []  Estella Husk, Pharm.D., BCPS, AAHIVP  Positive urine culture Treated with Cipro, organism sensitive to the same and no further patient follow-up is required at this time.  Kylie A Holland 01/21/2013, 5:09 PM

## 2013-01-23 ENCOUNTER — Other Ambulatory Visit: Payer: Self-pay | Admitting: Family Medicine

## 2013-01-23 NOTE — Telephone Encounter (Signed)
[facility] Triage Call Report Triage Record Num: [id number] Operator: Frederico Hamman Patient Name: Jon Fields Call Date & Time: 01/22/2013 6:14:07PM Patient Phone: [phone] PCP: Tera Mater. Clent Ridges Patient Gender: Male PCP Fax : [phone] Patient DOB: Aug 02, 1926 Practice Name: [facility]  Reason for Call: [name] states he was seen in the office recently. Asking if he is supposed to be taking his Glipizide and " another medication." Attempted to review his medications but stated " I don't think you can help me and [name] disconnected the call. This nurse called back. [name] stated I asked a question and I do not want you to know my medical history. [name] disconnected the call again. office note  Protocol(s) Used: Office Note Recommended Outcome per Protocol: Information Noted and Sent to Office Reason for Outcome: Caller information to office Care Advice: ~ 09/

## 2013-01-23 NOTE — Telephone Encounter (Signed)
Pt wants to know if he is to continue with Glipizide 10 mg bid? I do not know what other medication he is talking about?

## 2013-01-24 NOTE — Telephone Encounter (Signed)
I assume he wants to know whether to take both Glipizide and metformin for diabetes. The answer is YES he is to take both bid

## 2013-01-25 ENCOUNTER — Other Ambulatory Visit: Payer: Medicare Other

## 2013-01-25 ENCOUNTER — Telehealth: Payer: Self-pay | Admitting: Family Medicine

## 2013-01-25 NOTE — Telephone Encounter (Signed)
I spoke with Jon Fields and pt has been sick due to the Cipro. Per Dr. Clent Ridges okay to stop the medication, he only had 1 1/2 day left. Pt will check back with [place] if not feeling better once stopping the medication.

## 2013-01-25 NOTE — Telephone Encounter (Signed)
RN at [facility] states pt has elevated BP and wants a callback.  Thanks a bunch  [employer]

## 2013-01-30 ENCOUNTER — Ambulatory Visit
Admission: RE | Admit: 2013-01-30 | Discharge: 2013-01-30 | Disposition: A | Discharge status: Home / Self Care | Payer: Medicare Other | Source: Ambulatory Visit | Attending: Family Medicine | Admitting: Family Medicine

## 2013-01-30 DIAGNOSIS — M545 Low back pain: Secondary | ICD-10-CM

## 2013-02-02 NOTE — Addendum Note (Signed)
Addended by: Gershon Crane A on: 02/02/2013 12:57 PM   Modules accepted: Orders

## 2013-02-02 NOTE — Progress Notes (Signed)
Quick Note:  I spoke with pt and released results in my chart ______ 

## 2013-02-06 ENCOUNTER — Encounter (HOSPITAL_COMMUNITY): Payer: Self-pay | Admitting: Emergency Medicine

## 2013-02-06 ENCOUNTER — Emergency Department (HOSPITAL_COMMUNITY)
Admission: EM | Admit: 2013-02-06 | Discharge: 2013-02-07 | Disposition: A | Discharge status: Home / Self Care | Payer: Medicare Other | Attending: Emergency Medicine | Admitting: Emergency Medicine

## 2013-02-06 ENCOUNTER — Emergency Department (HOSPITAL_COMMUNITY): Payer: Medicare Other

## 2013-02-06 DIAGNOSIS — Z8709 Personal history of other diseases of the respiratory system: Secondary | ICD-10-CM | POA: Insufficient documentation

## 2013-02-06 DIAGNOSIS — I10 Essential (primary) hypertension: Secondary | ICD-10-CM | POA: Insufficient documentation

## 2013-02-06 DIAGNOSIS — Z79899 Other long term (current) drug therapy: Secondary | ICD-10-CM | POA: Insufficient documentation

## 2013-02-06 DIAGNOSIS — M129 Arthropathy, unspecified: Secondary | ICD-10-CM | POA: Insufficient documentation

## 2013-02-06 DIAGNOSIS — E559 Vitamin D deficiency, unspecified: Secondary | ICD-10-CM | POA: Insufficient documentation

## 2013-02-06 DIAGNOSIS — R059 Cough, unspecified: Secondary | ICD-10-CM | POA: Insufficient documentation

## 2013-02-06 DIAGNOSIS — R05 Cough: Secondary | ICD-10-CM | POA: Insufficient documentation

## 2013-02-06 DIAGNOSIS — F3289 Other specified depressive episodes: Secondary | ICD-10-CM | POA: Insufficient documentation

## 2013-02-06 DIAGNOSIS — M159 Polyosteoarthritis, unspecified: Secondary | ICD-10-CM | POA: Insufficient documentation

## 2013-02-06 DIAGNOSIS — IMO0001 Reserved for inherently not codable concepts without codable children: Secondary | ICD-10-CM | POA: Insufficient documentation

## 2013-02-06 DIAGNOSIS — Z791 Long term (current) use of non-steroidal anti-inflammatories (NSAID): Secondary | ICD-10-CM | POA: Insufficient documentation

## 2013-02-06 DIAGNOSIS — Z8669 Personal history of other diseases of the nervous system and sense organs: Secondary | ICD-10-CM | POA: Insufficient documentation

## 2013-02-06 DIAGNOSIS — F329 Major depressive disorder, single episode, unspecified: Secondary | ICD-10-CM | POA: Insufficient documentation

## 2013-02-06 DIAGNOSIS — Z792 Long term (current) use of antibiotics: Secondary | ICD-10-CM | POA: Insufficient documentation

## 2013-02-06 DIAGNOSIS — E119 Type 2 diabetes mellitus without complications: Secondary | ICD-10-CM | POA: Insufficient documentation

## 2013-02-06 DIAGNOSIS — N390 Urinary tract infection, site not specified: Secondary | ICD-10-CM

## 2013-02-06 DIAGNOSIS — Z87312 Personal history of (healed) stress fracture: Secondary | ICD-10-CM | POA: Insufficient documentation

## 2013-02-06 LAB — COMPREHENSIVE METABOLIC PANEL
CO2: 21 mEq/L (ref 19–32)
Calcium: 9 mg/dL (ref 8.4–10.5)
Creatinine, Ser: 0.8 mg/dL (ref 0.50–1.35)
GFR calc Af Amer: >90 mL/min (ref >90)
GFR calc non Af Amer: 80 mL/min — ABNORMAL LOW (ref >90)
Glucose, Bld: 171 mg/dL — ABNORMAL HIGH (ref 70–99)
Total Bilirubin: 0.6 mg/dL (ref 0.3–1.2)

## 2013-02-06 LAB — CBC WITH DIFFERENTIAL/PLATELET
Eosinophils Relative: 0 % (ref 0–5)
HCT: 38.2 % — ABNORMAL LOW (ref 39.0–52.0)
Hemoglobin: 13.3 g/dL (ref 13.0–17.0)
Lymphocytes Relative: 4 % — ABNORMAL LOW (ref 12–46)
Lymphs Abs: 0.4 10*3/uL — ABNORMAL LOW (ref 0.7–4.0)
MCV: 89.5 fL (ref 78.0–100.0)
Monocytes Absolute: 1 10*3/uL (ref 0.1–1.0)
Monocytes Relative: 10 % (ref 3–12)
RBC: 4.27 MIL/uL (ref 4.22–5.81)
WBC: 9.8 10*3/uL (ref 4.0–10.5)

## 2013-02-06 LAB — URINALYSIS, ROUTINE W REFLEX MICROSCOPIC
Bilirubin Urine: NEGATIVE
Nitrite: NEGATIVE
Protein, ur: NEGATIVE mg/dL
Specific Gravity, Urine: 1.025 (ref 1.005–1.030)
Urobilinogen, UA: 0.2 mg/dL (ref 0.0–1.0)

## 2013-02-06 LAB — URINE MICROSCOPIC-ADD ON

## 2013-02-06 MED ORDER — SODIUM CHLORIDE 0.9 % IV SOLN
INTRAVENOUS | Status: DC
  Administered 2013-02-06: 22:00:00 via INTRAVENOUS

## 2013-02-06 MED ORDER — IBUPROFEN 200 MG PO TABS
400.0000 mg | ORAL_TABLET | Freq: Once | ORAL | Status: AC
  Administered 2013-02-06: 400 mg via ORAL
  Filled 2013-02-06: qty 2

## 2013-02-06 MED ORDER — DEXTROSE 5 % IV SOLN
1.0000 g | INTRAVENOUS | Status: DC
  Administered 2013-02-06: 1 g via INTRAVENOUS
  Filled 2013-02-06: qty 10

## 2013-02-06 MED ORDER — CEPHALEXIN 500 MG PO CAPS: 500.0000 mg | ORAL_CAPSULE | Freq: Four times a day (QID) | ORAL | Status: DC

## 2013-02-06 NOTE — ED Notes (Addendum)
CG4 Lactic acid, 1.32. MD made aware not crossing over at this time

## 2013-02-06 NOTE — ED Notes (Signed)
Pt reports he was given a Tylenol at [facility].

## 2013-02-06 NOTE — ED Provider Notes (Signed)
CSN: [id number]     Arrival date & time 02/06/13  2144 History   First MD Initiated Contact with Patient 02/06/13 2150     Chief Complaint  Patient presents with  . Fever   (Consider location/radiation/quality/duration/timing/severity/associated sxs/prior Treatment) Patient is a 79 y.o. male presenting with fever. The history is provided by the patient.  Fever  patient here with temperature up to 101.9x1 day. Denies any vomiting or diarrhea. No headache or neck pain. No photophobia. Some nonproductive cough. No dysuria or hematuria. Denies any sore throat or ear pain. No rashes appreciated. Take Tylenol before arrival and now feels back to his baseline. Was transported by EMS at the insistence of the family for further evaluation. He has no complaints of at this time  Past Medical History  Diagnosis Date  . Sleep apnea, obstructive     sees Dr. Marcelyn Bruins, had sleep study 12-27-07  . Pulmonary fibrosis     sees Dr. Shelle Iron   . Hypothyroidism   . Diabetes mellitus     type 2  . Depression   . Hyperlipidemia   . Hypertension   . Vitamin D deficiency   . Erectile dysfunction   . PVD (peripheral vascular disease)   . Osteoarthritis, generalized   . Arthritis   . Unsteady gait   . Vertebral compression fracture   . BPH with urinary obstruction     sees Dr. Patsi Sears  . Diabetes mellitus type II, uncontrolled   . Bell's palsy     2013   Past Surgical History  Procedure Laterality Date  . Cholecystectomy    . Appendectomy    . Transurethral resection of prostate  May 2012    per Dr. Patsi Sears  . Colonoscopy  06-12-02    per Dr. Victorino Dike, diverticulosis only, repeat in 10  yrs    Family History  Problem Relation Age of Onset  . Pancreatic cancer Sister   . Colon cancer Neg Hx    History  Substance Use Topics  . Smoking status: Never Smoker   . Smokeless tobacco: Never Used  . Alcohol Use: No    Review of Systems  Constitutional: Positive for fever.  All other  systems reviewed and are negative.    Allergies  Review of patient's allergies indicates no known allergies.  Home Medications   Current Outpatient Rx  Name  Route  Sig  Dispense  Refill  . acetaminophen (TYLENOL) 500 MG tablet   Oral   Take 500 mg by mouth every 6 (six) hours as needed for pain or fever.         [name] aspirin 81 MG tablet   Oral   Take 81 mg by mouth 3 (three) times a week. Mon, Wed Fri         . ciprofloxacin (CIPRO) 500 MG tablet   Oral   Take 1 tablet (500 mg total) by mouth 2 (two) times daily.   20 tablet   0   . glipiZIDE (GLUCOTROL) 10 MG tablet   Oral   Take 1 tablet (10 mg total) by mouth 2 (two) times daily before a meal.   180 tablet   2   . glucose blood (ACCU-CHEK AVIVA PLUS) test strip      Test once per day and diagnosis is 250.00   100 each   1   . Lancets (ACCU-CHEK MULTICLIX) lancets      Use as instructed   102 each   11   .  lisinopril (PRINIVIL,ZESTRIL) 20 MG tablet   Oral   Take 1 tablet (20 mg total) by mouth daily.   90 tablet   2   . metFORMIN (GLUCOPHAGE) 500 MG tablet   Oral   Take 1 tablet (500 mg total) by mouth 2 (two) times daily with a meal.   180 tablet   3   . naproxen (NAPROSYN) 500 MG tablet   Oral   Take 1 tablet (500 mg total) by mouth 2 (two) times daily with a meal.   180 tablet   3   . traMADol (ULTRAM) 50 MG tablet      1-2 tablets po q 6 hours prn pain   60 tablet   1   . traMADol (ULTRAM) 50 MG tablet   Oral   Take 2 tablets (100 mg total) by mouth every 6 (six) hours as needed for pain.   60 tablet   5   . triamcinolone cream (KENALOG) 0.1 %   Topical   Apply topically 2 (two) times daily.   45 g   5   . Vitamin D, Ergocalciferol, (DRISDOL) 50000 UNITS CAPS capsule   Oral   Take 1 capsule (50,000 Units total) by mouth every 7 (seven) days.   12 capsule   3    BP 145/66  Pulse 107  Temp(Src) 100.1 F (37.8 C) (Oral)  Resp 14  SpO2 94% Physical Exam  Nursing note  and vitals reviewed. Constitutional: He is oriented to person, place, and time. He appears well-developed and well-nourished.  Non-toxic appearance. No distress.  HENT:  Head: Normocephalic and atraumatic.  Eyes: Conjunctivae, EOM and lids are normal. Pupils are equal, round, and reactive to light.  Neck: Normal range of motion. Neck supple. No tracheal deviation present. No mass present.  Cardiovascular: Normal rate, regular rhythm and normal heart sounds.  Exam reveals no gallop.   No murmur heard. Pulmonary/Chest: Effort normal and breath sounds normal. No stridor. No respiratory distress. He has no decreased breath sounds. He has no wheezes. He has no rhonchi. He has no rales.  Abdominal: Soft. Normal appearance and bowel sounds are normal. He exhibits no distension. There is no tenderness. There is no rebound and no CVA tenderness.  Musculoskeletal: Normal range of motion. He exhibits no edema and no tenderness.  Neurological: He is alert and oriented to person, place, and time. He has normal strength. No cranial nerve deficit or sensory deficit. GCS eye subscore is 4. GCS verbal subscore is 5. GCS motor subscore is 6.  Skin: Skin is warm and dry. No abrasion and no rash noted.  Psychiatric: He has a normal mood and affect. His speech is normal and behavior is normal.    ED Course  Procedures (including critical care time) Labs Review Labs Reviewed  CBC WITH DIFFERENTIAL  COMPREHENSIVE METABOLIC PANEL  URINALYSIS, ROUTINE W REFLEX MICROSCOPIC   Imaging Review No results found.  MDM  No diagnosis found. Patient without signs of sepsis at this time. Does have a UTI and given Rocephin IV piggyback as well as Motrin for his low-grade temperature. Was reassessed multiple times in mental status is normal. No concerns meningitis either. Will place patient on Keflex and patient to followup with his Dr.    Toy Baker, MD 02/06/13 [phone]

## 2013-02-06 NOTE — ED Notes (Signed)
Bed: WJ19 Expected date:  Expected time:  Means of arrival:  Comments: EMS/79 yo fever

## 2013-02-06 NOTE — ED Notes (Signed)
Per EMS, pt from [facility], family reported he had a fever of 101.9 and wanted him to be evaluated. Pt denies any complaints to EMS.

## 2013-02-07 LAB — CG4 I-STAT (LACTIC ACID): Lactic Acid, Venous: 1.32 mmol/L (ref 0.5–2.2)

## 2013-02-08 ENCOUNTER — Telehealth: Payer: Self-pay | Admitting: Family Medicine

## 2013-02-08 LAB — URINE CULTURE

## 2013-02-08 NOTE — Telephone Encounter (Signed)
Patient Information:  Caller Name: Lynden Ang  Phone: [phone]  Patient: Jon Fields, Jon Fields  Gender: Male  DOB: [birth year]  Age: 79 Years  PCP: Gershon Crane [facility])  Office Follow Up:  Does the office need to follow up with this patient?: No  Instructions For The Office: N/A  RN Note:  F/U UTI, see in ED on 10-7, low blood sugar on 10-9.  Pt self cath.  Poor appetite on Keflex for UTI.  FSBS was 55 on 10-9 before breakfast. Daughter is not w/ Pt.  Pt lives in independent living facility, Pt checks his own blood sugar called Daughter to let her know, Pt had breakfast, FSBS increased to 110. Daughter denies sxs at triage. Attempted to reach Pt, [phone], vmail.  Daughter will keep check on Pt and call back if finger stick is below 70 again on 10-10 before breakfast or sxs worsen.  Daughter will f/u w/ office once Pt finishes Keflex for repeat UA.  Symptoms  Reason For Call & Symptoms: F/U UTI, see in ED on 10-7, low blood sugar on 10-9.  Pt self cath.  Poor appetite on Keflex for UTI.  FSBS was 55 on 10-9 before breakfast.  Reviewed Health History In EMR: Yes  Reviewed Medications In EMR: Yes  Reviewed Allergies In EMR: Yes  Reviewed Surgeries / Procedures: Yes  Date of Onset of Symptoms: 02/06/2013  Treatments Tried: Glipizide and Metformin  Treatments Tried Worked: No  Guideline(s) Used:  Diabetes - Low Blood Sugar  Disposition Per Guideline:   Home Care  Reason For Disposition Reached:   Blood glucose < 70 mg/dl (3.9 mmol/l) or symptomatic AND has other adult present  Advice Given:  N/A  Patient Will Follow Care Advice:  YES

## 2013-02-16 ENCOUNTER — Telehealth: Payer: Self-pay | Admitting: Family Medicine

## 2013-02-16 NOTE — Telephone Encounter (Signed)
Opened in error

## 2013-02-16 NOTE — Telephone Encounter (Deleted)
Pt's BS reading this morning

## 2013-02-19 ENCOUNTER — Encounter: Payer: Self-pay | Admitting: Family Medicine

## 2013-02-19 ENCOUNTER — Ambulatory Visit (INDEPENDENT_AMBULATORY_CARE_PROVIDER_SITE_OTHER): Payer: Medicare Other | Admitting: Family Medicine

## 2013-02-19 ENCOUNTER — Telehealth: Payer: Self-pay | Admitting: Family Medicine

## 2013-02-19 VITALS — BP 130/58 | HR 78 | Temp 98.2°F | Wt 237.0 lb

## 2013-02-19 DIAGNOSIS — F329 Major depressive disorder, single episode, unspecified: Secondary | ICD-10-CM

## 2013-02-19 DIAGNOSIS — N390 Urinary tract infection, site not specified: Secondary | ICD-10-CM

## 2013-02-19 DIAGNOSIS — R079 Chest pain, unspecified: Secondary | ICD-10-CM

## 2013-02-19 DIAGNOSIS — F3289 Other specified depressive episodes: Secondary | ICD-10-CM

## 2013-02-19 MED ORDER — SERTRALINE HCL 50 MG PO TABS: 50.0000 mg | ORAL_TABLET | Freq: Every day | ORAL | Status: DC

## 2013-02-19 MED ORDER — SULFAMETHOXAZOLE-TMP DS 800-160 MG PO TABS: 1.0000 | ORAL_TABLET | Freq: Two times a day (BID) | ORAL | Status: DC

## 2013-02-19 NOTE — Telephone Encounter (Signed)
Pt states that when he was in earlier Dr. Clent Ridges asked if he had seen a cardiologist, pt forgot to tell Dr. Clent Ridges that he did in fact visit one last year (does not remember his name, but it is whoever Dr. Clent Ridges referred him to), but pt says the encounter was really brief, pt says the cardiologist didn't see a need for him to be there. Please note.

## 2013-02-19 NOTE — Progress Notes (Signed)
  Subjective:    Patient ID: Jon Fields, male    DOB: 01-Oct-1926, 79 y.o.   MRN: [id number]  HPI Here for several issues. First he has felt very fatigued for several weeks and he is not sure why. He wants to sit around all day and that is not his normal state. He gets tired with minimal exertion and he has had several episodes of chest pain. These pains are dull and cause a tightness or pressure in the chest which lasts about 15 minutes and then goes away. This has happened twice while he was in the shower. No SOB or sweats. He does get nauseated at times but has not vomited. No fever. He had a normal cardiac stress test in March 2012. He has been treated twice for an E coli UTI in the past month, first with Cipro and then Keflex. He is not sure if these worked or not. He has no burning or urgency since he caths himself twice a day. His glucoses have been averaging 110 to 115 in the mornings.  He also mentions some depression over the last few months. He gets sad frequently and cries a lot. He says he feels lonely. He sleeps well.    Review of Systems  Constitutional: Positive for fatigue. Negative for fever.  Respiratory: Negative.   Cardiovascular: Positive for chest pain. Negative for palpitations and leg swelling.  Gastrointestinal: Negative.   Genitourinary: Negative.   Neurological: Negative.        Objective:   Physical Exam  Constitutional: He appears well-developed and well-nourished. No distress.  Eyes: Conjunctivae are normal. Pupils are equal, round, and reactive to light.  Cardiovascular: Normal rate, normal heart sounds and intact distal pulses.   No murmur heard. Irregular rhythm. EKG shows sinus with a few PVCs   Pulmonary/Chest: Effort normal and breath sounds normal. No respiratory distress. He has no wheezes. He has no rales.  Psychiatric: His behavior is normal. Thought content normal.  He appears depressed and becomes tearful during our interview           Assessment & Plan:  He may have some low level residual UTI so we will give him 30 days of Bactrim DS. He is depressed so start on Zoloft. Refer to Cardiology for the chest pains. See me again in 2 weeks .

## 2013-02-23 ENCOUNTER — Other Ambulatory Visit (INDEPENDENT_AMBULATORY_CARE_PROVIDER_SITE_OTHER): Payer: Medicare Other

## 2013-02-23 DIAGNOSIS — N390 Urinary tract infection, site not specified: Secondary | ICD-10-CM

## 2013-02-23 LAB — POCT URINALYSIS DIPSTICK
Glucose, UA: NEGATIVE
Nitrite, UA: NEGATIVE
Protein, UA: NEGATIVE
Spec Grav, UA: >=1.03
Urobilinogen, UA: 0.2

## 2013-02-23 NOTE — Telephone Encounter (Signed)
I spoke with pt and he did start on the Bactrim given on 02/19/13.

## 2013-02-23 NOTE — Telephone Encounter (Signed)
As for the cardiologist, I would like for him to see them unless they feel this is not needed. He has a UTI by a positive UA today, but I gave him a rx for Bactrim DS several days ago. Has he started this yet?

## 2013-02-23 NOTE — Telephone Encounter (Signed)
I left a voice message for pt to return my call.  

## 2013-02-23 NOTE — Telephone Encounter (Signed)
Pt wants results for stress test.

## 2013-02-23 NOTE — Telephone Encounter (Signed)
Pt really wants to know if he indeed needs to see a cardiologist? He did get the call about scheduling and he didn't think that he needed to go.

## 2013-02-23 NOTE — Telephone Encounter (Signed)
Per Dr. Clent Ridges, pt should finish all of the Bactrim and then we will recheck a urine.

## 2013-02-23 NOTE — Telephone Encounter (Signed)
I spoke with pt and gave the below information. Also pt needs the cardiologist office that he was referred to give him a call back to schedule the appointment. He did not set this up the first time they called. Can you ask them to call pt again? He did not remember who called.

## 2013-02-23 NOTE — Telephone Encounter (Signed)
This is a duplicate phone note.  

## 2013-02-26 LAB — URINE CULTURE: Colony Count: 50000

## 2013-02-27 MED ORDER — CIPROFLOXACIN HCL 500 MG PO TABS: 500.0000 mg | ORAL_TABLET | Freq: Two times a day (BID) | ORAL | Status: DC

## 2013-02-27 NOTE — Addendum Note (Signed)
Addended by: Aniceto Boss A on: 02/27/2013 02:37 PM   Modules accepted: Orders

## 2013-02-27 NOTE — Progress Notes (Signed)
Quick Note:  Per Dr. Clent Ridges, should be Cipro 500 mg and I did send script e-scribe to [facility]. I left a voice message for pt to return my call and released results in my chart. ______

## 2013-02-28 NOTE — Progress Notes (Signed)
Quick Note:  I spoke with pt ______ 

## 2013-03-01 ENCOUNTER — Institutional Professional Consult (permissible substitution): Payer: Medicare Other | Admitting: Cardiology

## 2013-03-01 NOTE — Telephone Encounter (Signed)
Spoke with pt, was going to get him connected with [facility] directly to reschedule but pt hung up before I could transfer call. I went ahead and rescheduled appt for pt. Pt is scheduled 03/15/13 @ 3:30 with Dr. Melburn Popper. Called pt back to give information but was unable to leave message. Will try again at a later time.

## 2013-03-07 ENCOUNTER — Encounter: Payer: Self-pay | Admitting: Geriatric Medicine

## 2013-03-15 ENCOUNTER — Ambulatory Visit (INDEPENDENT_AMBULATORY_CARE_PROVIDER_SITE_OTHER): Payer: Medicare Other | Admitting: Cardiovascular Disease

## 2013-03-15 ENCOUNTER — Encounter: Payer: Self-pay | Admitting: Cardiovascular Disease

## 2013-03-15 VITALS — BP 110/62 | HR 80 | Ht 70.0 in | Wt 237.0 lb

## 2013-03-15 DIAGNOSIS — R0602 Shortness of breath: Secondary | ICD-10-CM

## 2013-03-15 DIAGNOSIS — R079 Chest pain, unspecified: Secondary | ICD-10-CM

## 2013-03-15 DIAGNOSIS — R0789 Other chest pain: Secondary | ICD-10-CM

## 2013-03-15 NOTE — Progress Notes (Signed)
Jon Fields Date of Birth  1927-03-14 [facility]     [place] Office  [address]    [address]   [address] [place], [place]  [address]    [place], [place]  [address] [phone]  Fax  [phone]  [phone]  Fax [phone]  Problem list: 1. History of pulmonary fibrosis 2. Diabetes Mellitus, 3. hypertension 4. Hypothyroidism 5. Chronic UTI  History of Present Illness:  Jon Fields is an 79 yo with hx of pulmonary fibrosis.  He has rare episodes of chest pains. He because of his pulmonary fibrosis he has limited exertional capacity. He's had a normal stress test a year ago. His chest pains are very atypical. He had one episode of chest pain that occurred with rest. He does not have any chest discomfort with exertion.  Nov. 13, 2014:  2 weeks ago he had an episode of CP - lasted for 10-15 minutes.  Off and on for a week.   He has chronic UTIs.  His medical doctor has requested a stress myoview.  He had a stress myoview in march 2012 - reportedly normal.     Current Outpatient Prescriptions on File Prior to Visit  Medication Sig Dispense Refill  . acetaminophen (TYLENOL) 500 MG tablet Take 500 mg by mouth every 6 (six) hours as needed for pain or fever.      [name] aspirin 81 MG tablet Take 81 mg by mouth 3 (three) times a week. Mon, Wed Fri      . glipiZIDE (GLUCOTROL) 10 MG tablet Take 1 tablet (10 mg total) by mouth 2 (two) times daily before a meal.  180 tablet  2  . ibuprofen (ADVIL,MOTRIN) 200 MG tablet Take 200 mg by mouth every 6 (six) hours as needed for pain.      [name] lisinopril (PRINIVIL,ZESTRIL) 20 MG tablet Take 1 tablet (20 mg total) by mouth daily.  90 tablet  2  . metFORMIN (GLUCOPHAGE) 500 MG tablet Take 500 mg by mouth 2 (two) times daily with a meal.      . naproxen (NAPROSYN) 500 MG tablet Take 1 tablet (500 mg total) by mouth 2 (two) times daily with a meal.  180 tablet  3  . sertraline (ZOLOFT) 50 MG tablet Take 1 tablet (50 mg total) by  mouth daily.  30 tablet  5  . traMADol (ULTRAM) 50 MG tablet Take 2 tablets (100 mg total) by mouth every 6 (six) hours as needed for pain.  60 tablet  5  . Vitamin D, Ergocalciferol, (DRISDOL) 50000 UNITS CAPS capsule Take 1 capsule (50,000 Units total) by mouth every 7 (seven) days.  12 capsule  3   No current facility-administered medications on file prior to visit.    No Known Allergies  Past Medical History  Diagnosis Date  . Sleep apnea, obstructive     sees Dr. Marcelyn Bruins, had sleep study 12-27-07  . Pulmonary fibrosis     sees Dr. Shelle Iron   . Hypothyroidism   . Diabetes mellitus     type 2  . Depression   . Hyperlipidemia   . Hypertension   . Vitamin D deficiency   . Erectile dysfunction   . PVD (peripheral vascular disease)   . Osteoarthritis, generalized   . Arthritis   . Unsteady gait   . Vertebral compression fracture   . BPH with urinary obstruction     sees Dr. Patsi Sears  . Diabetes mellitus type II, uncontrolled   .  Bell's palsy     2013    Past Surgical History  Procedure Laterality Date  . Cholecystectomy    . Appendectomy    . Transurethral resection of prostate  May 2012    per Dr. Patsi Sears  . Colonoscopy  06-12-02    per Dr. Victorino Dike, diverticulosis only, repeat in 10  yrs     History  Smoking status  . Never Smoker   Smokeless tobacco  . Never Used    History  Alcohol Use No    Family History  Problem Relation Age of Onset  . Pancreatic cancer Sister   . Colon cancer Neg Hx     Reviw of Systems:  Reviewed in the HPI.  All other systems are negative.  Physical Exam: Blood pressure 110/62, pulse 80, height 5\' 10"  (1.778 m), weight 237 lb (107.502 kg). General: Well developed, well nourished, in no acute distress.  Head: Normocephalic, atraumatic, sclera non-icteric, mucus membranes are moist,   Neck: Supple. Carotids are 2 + without bruits. No JVD  Lungs: He has fine rales in both bases. He has decreased breath sounds in  the left base.  Heart: regular rate.  normal  S1 S2. No murmurs, gallops or rubs.  Abdomen: Soft, non-tender, non-distended with normal bowel sounds. No hepatomegaly. No rebound/guarding. No masses.  Msk:  Strength and tone are normal  Extremities: No clubbing or cyanosis. No edema.  Distal pedal pulses are 2+ and equal bilaterally.  Neuro: Alert and oriented X 3. Moves all extremities spontaneously.  Psych:  Responds to questions appropriately with a normal affect.  ECG: Nov. 13, 2014:  NSR wth 1st degree AV block, RBBB, LAFS, LVH with repol  Assessment / Plan:

## 2013-03-15 NOTE — Patient Instructions (Signed)
Your physician has requested that you have a lexiscan myoview.  Please follow instruction sheet, as given.  Your physician recommends that you schedule a follow-up appointment in: as needed

## 2013-03-15 NOTE — Assessment & Plan Note (Signed)
Mr. Lebleu presents for further evaluation of some episodes of chest discormort.  His symptoms are somewhat concerning.  He is not able to ambulate very well and has significant ECG abnormalities.  Will get a [employer]  I will see him on as needed basis - will see him soon if the myoview is abnormal.

## 2013-03-19 ENCOUNTER — Other Ambulatory Visit (INDEPENDENT_AMBULATORY_CARE_PROVIDER_SITE_OTHER): Payer: Medicare Other

## 2013-03-19 ENCOUNTER — Telehealth: Payer: Self-pay | Admitting: Family Medicine

## 2013-03-19 DIAGNOSIS — N390 Urinary tract infection, site not specified: Secondary | ICD-10-CM

## 2013-03-19 LAB — POCT URINALYSIS DIPSTICK
Glucose, UA: NEGATIVE
Nitrite, UA: NEGATIVE
Urobilinogen, UA: 0.2
pH, UA: 5.5

## 2013-03-19 NOTE — Telephone Encounter (Signed)
Per Dr. Clent Ridges, order a UA and pt is here now to give this sample.

## 2013-03-19 NOTE — Addendum Note (Signed)
Addended by: Gershon Crane A on: 03/19/2013 05:48 PM   Modules accepted: Orders

## 2013-04-04 ENCOUNTER — Ambulatory Visit (HOSPITAL_COMMUNITY): Payer: Medicare Other | Attending: Cardiovascular Disease | Admitting: Radiology

## 2013-04-04 VITALS — Ht 70.0 in | Wt 244.0 lb

## 2013-04-04 DIAGNOSIS — R9431 Abnormal electrocardiogram [ECG] [EKG]: Secondary | ICD-10-CM | POA: Insufficient documentation

## 2013-04-04 DIAGNOSIS — E119 Type 2 diabetes mellitus without complications: Secondary | ICD-10-CM | POA: Insufficient documentation

## 2013-04-04 DIAGNOSIS — R079 Chest pain, unspecified: Secondary | ICD-10-CM | POA: Insufficient documentation

## 2013-04-04 DIAGNOSIS — E785 Hyperlipidemia, unspecified: Secondary | ICD-10-CM | POA: Insufficient documentation

## 2013-04-04 DIAGNOSIS — Z8249 Family history of ischemic heart disease and other diseases of the circulatory system: Secondary | ICD-10-CM | POA: Insufficient documentation

## 2013-04-04 DIAGNOSIS — R0602 Shortness of breath: Secondary | ICD-10-CM

## 2013-04-04 DIAGNOSIS — I10 Essential (primary) hypertension: Secondary | ICD-10-CM | POA: Insufficient documentation

## 2013-04-04 DIAGNOSIS — R002 Palpitations: Secondary | ICD-10-CM | POA: Insufficient documentation

## 2013-04-04 DIAGNOSIS — R0609 Other forms of dyspnea: Secondary | ICD-10-CM | POA: Insufficient documentation

## 2013-04-04 DIAGNOSIS — R0989 Other specified symptoms and signs involving the circulatory and respiratory systems: Secondary | ICD-10-CM | POA: Insufficient documentation

## 2013-04-04 DIAGNOSIS — I4510 Unspecified right bundle-branch block: Secondary | ICD-10-CM | POA: Insufficient documentation

## 2013-04-04 MED ORDER — REGADENOSON 0.4 MG/5ML IV SOLN
0.4000 mg | Freq: Once | INTRAVENOUS | Status: AC
  Administered 2013-04-04: 0.4 mg via INTRAVENOUS

## 2013-04-04 MED ORDER — TECHNETIUM TC 99M SESTAMIBI GENERIC - CARDIOLITE
10.0000 | Freq: Once | INTRAVENOUS | Status: AC | PRN
  Administered 2013-04-04: 10 via INTRAVENOUS

## 2013-04-04 MED ORDER — TECHNETIUM TC 99M SESTAMIBI GENERIC - CARDIOLITE
30.0000 | Freq: Once | INTRAVENOUS | Status: AC | PRN
  Administered 2013-04-04: 30 via INTRAVENOUS

## 2013-04-04 NOTE — Progress Notes (Signed)
[facility] SITE 3 NUCLEAR MED [address] [place], [place] [address] [phone]    Cardiology Nuclear Med Study  Jon Fields is a 79 y.o. male     MRN : [id number]     DOB: November 21, 1926  Procedure Date: 04/04/2013  Nuclear Med Background Indication for Stress Test:  Evaluation for Ischemia and Abnormal EKG History:  1996 Cath 3/12 MPI:EF:54% 4/13 ECHO: EF: 60-65% Cardiac Risk Factors: Family History - CAD, Hypertension, Lipids, NIDDM and RBBB  Symptoms:  Chest Pain, DOE, Palpitations and SOB   Nuclear Pre-Procedure Caffeine/Decaff Intake:  None NPO After: 8:00pm   Lungs:  clear O2 Sat: 100% on room air. IV 0.9% NS with Angio Cath:  22g  IV Site: R Forearm  IV Started by:  Bonnita Levan, RN  Chest Size (in):  50 Cup Size: n/a  Height: 5\' 10"  (1.778 m)  Weight:  244 lb (110.678 kg)  BMI:  Body mass index is 35.01 kg/(m^2). Tech Comments:  N/A    Nuclear Med Study 1 or 2 day study: 1 day  Stress Test Type:  Lexiscan  Reading MD: Cassell Clement, MD  Order Authorizing Provider:  Kristeen Miss, MD  Resting Radionuclide: Technetium 71m Sestamibi  Resting Radionuclide Dose: 11.0 mCi   Stress Radionuclide:  Technetium 19m Sestamibi  Stress Radionuclide Dose: 33.0 mCi           Stress Protocol Rest HR: 60 Stress HR: 76  Rest BP: 151/76 Stress: BP: 152/76  Exercise Time (min): n/a METS: n/a   Predicted Max HR: 137 bpm % Max HR: 55.47 bpm Rate Pressure Product: 21308   Dose of Adenosine (mg):  n/a Dose of Lexiscan: 0.4 mg  Dose of Atropine (mg): n/a Dose of Dobutamine: n/a mcg/kg/min (at max HR)  Stress Test Technologist: Milana Na, EMT-P  Nuclear Technologist:  Domenic Polite, CNMT     Rest Procedure:  Myocardial perfusion imaging was performed at rest 45 minutes following the intravenous administration of Technetium 67m Sestamibi. Rest ECG: LAFB  Stress Procedure:  The patient received IV Lexiscan 0.4 mg over 15-seconds.  Technetium 1m  Sestamibi injected at 30-seconds. This patient had a headache with the Lexiscan injection. Quantitative spect images were obtained after a 45 minute delay. Stress ECG: No significant change from baseline ECG  QPS Raw Data Images:  Normal; no motion artifact; normal heart/lung ratio. Stress Images:  There is decreased uptake in the lateral wall. Rest Images:  There is decreased uptake in the lateral wall. Subtraction (SDS):  These findings are consistent with ischemia. Transient Ischemic Dilatation (Normal <1.22):  1.09 Lung/Heart Ratio (Normal <0.45):  0.32  Quantitative Gated Spect Images QGS EDV:  136 ml QGS ESV:  85 ml  Impression Exercise Capacity:  Lexiscan with no exercise. BP Response:  Normal blood pressure response. Clinical Symptoms:  No significant symptoms noted. ECG Impression:  No significant ECG changes with Lexiscan. Comparison with Prior Nuclear Study: No images to compare  Overall Impression:  High risk stress nuclear study. There is a medium size, moderate severity partially reversible defect involving the mid inferolateral myocardium. There is also a medium size, moderate severity reversible defect involving the apical myocardium. There is LV systolic dysfunction with EF 37%  LV Ejection Fraction: 37%.  LV Wall Motion:  Global hypokinesis  Cassell Clement

## 2013-04-05 ENCOUNTER — Ambulatory Visit: Payer: Medicare Other | Admitting: *Deleted

## 2013-04-05 ENCOUNTER — Ambulatory Visit: Payer: Medicare Other | Admitting: Family Medicine

## 2013-04-05 ENCOUNTER — Encounter (HOSPITAL_COMMUNITY): Payer: Self-pay | Admitting: Pharmacy Technician

## 2013-04-05 ENCOUNTER — Telehealth: Payer: Self-pay | Admitting: *Deleted

## 2013-04-05 ENCOUNTER — Encounter: Payer: Self-pay | Admitting: *Deleted

## 2013-04-05 DIAGNOSIS — Z01818 Encounter for other preprocedural examination: Secondary | ICD-10-CM

## 2013-04-05 DIAGNOSIS — R079 Chest pain, unspecified: Secondary | ICD-10-CM

## 2013-04-05 DIAGNOSIS — R9439 Abnormal result of other cardiovascular function study: Secondary | ICD-10-CM

## 2013-04-05 LAB — CBC WITH DIFFERENTIAL/PLATELET
Basophils Relative: 1 % (ref 0–1)
Eosinophils Relative: 2 % (ref 0–5)
HCT: 36 % — ABNORMAL LOW (ref 39.0–52.0)
Hemoglobin: 12.1 g/dL — ABNORMAL LOW (ref 13.0–17.0)
Lymphocytes Relative: 15 % (ref 12–46)
Lymphs Abs: 1.4 10*3/uL (ref 0.7–4.0)
MCH: 30.3 pg (ref 26.0–34.0)
MCV: 90 fL (ref 78.0–100.0)
Monocytes Absolute: 0.9 10*3/uL (ref 0.1–1.0)
Monocytes Relative: 10 % (ref 3–12)
Neutrophils Relative %: 72 % (ref 43–77)
RBC: 4 MIL/uL — ABNORMAL LOW (ref 4.22–5.81)
WBC: 9.4 10*3/uL (ref 4.0–10.5)

## 2013-04-05 LAB — BASIC METABOLIC PANEL
Chloride: 104 mEq/L (ref 96–112)
Potassium: 4 mEq/L (ref 3.5–5.3)
Sodium: 141 mEq/L (ref 135–145)

## 2013-04-05 NOTE — Telephone Encounter (Signed)
Pt has an abn stress myoview/ discussed with pt and was advised to precede with a LHC, pt accepted plan and verbalized understanding, Labs today cath 04/06/13 with Dr Elease Hashimoto.

## 2013-04-06 ENCOUNTER — Ambulatory Visit (HOSPITAL_COMMUNITY)
Admission: RE | Admit: 2013-04-06 | Discharge: 2013-04-06 | Disposition: A | Discharge status: Home / Self Care | Payer: Medicare Other | Source: Ambulatory Visit | Attending: Cardiovascular Disease | Admitting: Cardiovascular Disease

## 2013-04-06 ENCOUNTER — Encounter (HOSPITAL_COMMUNITY)
Admission: RE | Disposition: A | Discharge status: Home / Self Care | Payer: Self-pay | Source: Ambulatory Visit | Attending: Cardiovascular Disease

## 2013-04-06 DIAGNOSIS — G4733 Obstructive sleep apnea (adult) (pediatric): Secondary | ICD-10-CM | POA: Insufficient documentation

## 2013-04-06 DIAGNOSIS — I10 Essential (primary) hypertension: Secondary | ICD-10-CM | POA: Insufficient documentation

## 2013-04-06 DIAGNOSIS — E119 Type 2 diabetes mellitus without complications: Secondary | ICD-10-CM | POA: Insufficient documentation

## 2013-04-06 DIAGNOSIS — I2510 Atherosclerotic heart disease of native coronary artery without angina pectoris: Secondary | ICD-10-CM

## 2013-04-06 DIAGNOSIS — R0602 Shortness of breath: Secondary | ICD-10-CM | POA: Insufficient documentation

## 2013-04-06 DIAGNOSIS — Z792 Long term (current) use of antibiotics: Secondary | ICD-10-CM | POA: Insufficient documentation

## 2013-04-06 DIAGNOSIS — Z7982 Long term (current) use of aspirin: Secondary | ICD-10-CM | POA: Insufficient documentation

## 2013-04-06 DIAGNOSIS — I509 Heart failure, unspecified: Secondary | ICD-10-CM | POA: Insufficient documentation

## 2013-04-06 DIAGNOSIS — I2789 Other specified pulmonary heart diseases: Secondary | ICD-10-CM | POA: Insufficient documentation

## 2013-04-06 DIAGNOSIS — I059 Rheumatic mitral valve disease, unspecified: Secondary | ICD-10-CM

## 2013-04-06 DIAGNOSIS — R269 Unspecified abnormalities of gait and mobility: Secondary | ICD-10-CM | POA: Insufficient documentation

## 2013-04-06 DIAGNOSIS — E785 Hyperlipidemia, unspecified: Secondary | ICD-10-CM | POA: Insufficient documentation

## 2013-04-06 DIAGNOSIS — R9439 Abnormal result of other cardiovascular function study: Secondary | ICD-10-CM | POA: Insufficient documentation

## 2013-04-06 DIAGNOSIS — E039 Hypothyroidism, unspecified: Secondary | ICD-10-CM | POA: Insufficient documentation

## 2013-04-06 HISTORY — PX: LEFT HEART CATHETERIZATION WITH CORONARY ANGIOGRAM: SHX5451

## 2013-04-06 LAB — GLUCOSE, CAPILLARY: Glucose-Capillary: 156 mg/dL — ABNORMAL HIGH (ref 70–99)

## 2013-04-06 SURGERY — LEFT HEART CATHETERIZATION WITH CORONARY ANGIOGRAM
Anesthesia: LOCAL

## 2013-04-06 MED ORDER — SODIUM CHLORIDE 0.9 % IV SOLN
INTRAVENOUS | Status: DC
  Administered 2013-04-06: 08:00:00 via INTRAVENOUS

## 2013-04-06 MED ORDER — SODIUM CHLORIDE 0.9 % IJ SOLN: 3.0000 mL | INTRAMUSCULAR | Status: DC | PRN

## 2013-04-06 MED ORDER — NITROGLYCERIN 0.2 MG/ML ON CALL CATH LAB
INTRAVENOUS | Status: AC
  Filled 2013-04-06: qty 1

## 2013-04-06 MED ORDER — VERAPAMIL HCL 2.5 MG/ML IV SOLN
INTRAVENOUS | Status: AC
  Filled 2013-04-06: qty 2

## 2013-04-06 MED ORDER — ASPIRIN 81 MG PO CHEW: 81.0000 mg | CHEWABLE_TABLET | ORAL | Status: DC

## 2013-04-06 MED ORDER — SODIUM CHLORIDE 0.9 % IV SOLN: 250.0000 mL | INTRAVENOUS | Status: DC | PRN

## 2013-04-06 MED ORDER — HEPARIN (PORCINE) IN NACL 2-0.9 UNIT/ML-% IJ SOLN
INTRAMUSCULAR | Status: AC
  Filled 2013-04-06: qty 1500

## 2013-04-06 MED ORDER — LIDOCAINE HCL (PF) 1 % IJ SOLN
INTRAMUSCULAR | Status: AC
  Filled 2013-04-06: qty 30

## 2013-04-06 MED ORDER — FENTANYL CITRATE 0.05 MG/ML IJ SOLN
INTRAMUSCULAR | Status: AC
  Filled 2013-04-06: qty 2

## 2013-04-06 MED ORDER — MIDAZOLAM HCL 2 MG/2ML IJ SOLN
INTRAMUSCULAR | Status: AC
  Filled 2013-04-06: qty 2

## 2013-04-06 MED ORDER — ONDANSETRON HCL 4 MG/2ML IJ SOLN: 4.0000 mg | Freq: Four times a day (QID) | INTRAMUSCULAR | Status: DC | PRN

## 2013-04-06 MED ORDER — SODIUM CHLORIDE 0.9 % IJ SOLN: 3.0000 mL | Freq: Two times a day (BID) | INTRAMUSCULAR | Status: DC

## 2013-04-06 MED ORDER — SODIUM CHLORIDE 0.9 % IV SOLN: INTRAVENOUS | Status: DC

## 2013-04-06 MED ORDER — ACETAMINOPHEN 325 MG PO TABS: 650.0000 mg | ORAL_TABLET | ORAL | Status: DC | PRN

## 2013-04-06 NOTE — Progress Notes (Signed)
Patient ID: Jon Fields, male   DOB: [birth year], 79 y.o.   MRN: [id number]      [address]       [place] [address]             [phone]                    KOBEE MEDLEN [facility] Medical Record #[id number] Date of Birth: 1926/08/08  Referring: No ref. provider found Primary Care: Nelwyn Salisbury, MD  Chief Complaint:   No chief complaint on file.   History of Present Illness:    Jon Fields 79 y.o. male is seen in the hospital  today  After cardiac cath. Patient has several year history of physical decline with sob,increasing unsteady gait. More recently has had chest discomfort. A stress test  positive for  apical ischemia and cath done today. Patient followed by Dr Shelle Iron for pulmonary fibrosis documented on serial ct of chest, not dependent on o2.  He has had no known MI, but has had drop in ef from 60% to 30 % . Patient also has had recent visits for UTI ? Urosepsis and is now i/o cath due to bladder obstruction   Current Activity/ Functional Status:  Patient is independent with mobility/ambulation, transfers, ADL's, IADL's.  Zubrod Score: At the time of surgery this patient's most appropriate activity status/level should be described as: []  Normal activity, no symptoms []  Symptoms, fully ambulatory [x]  Symptoms, in bed less than or equal to 50% of the time []  Symptoms, in bed greater than 50% of the time but less than 100% []  Bedridden []  Moribund   Past Medical History  Diagnosis Date  . Sleep apnea, obstructive     sees Dr. Marcelyn Bruins, had sleep study 12-27-07  . Pulmonary fibrosis     sees Dr. Shelle Iron   . Hypothyroidism   . Diabetes mellitus     type 2  . Depression   . Hyperlipidemia   . Hypertension   . Vitamin D deficiency   . Erectile dysfunction   . PVD (peripheral vascular disease)   . Osteoarthritis, generalized   . Arthritis   . Unsteady gait   . Vertebral compression fracture   . BPH with urinary obstruction       sees Dr. Patsi Sears  . Diabetes mellitus type II, uncontrolled   . Bell's palsy     2013    Past Surgical History  Procedure Laterality Date  . Cholecystectomy    . Appendectomy    . Transurethral resection of prostate  May 2012    per Dr. Patsi Sears  . Colonoscopy  06-12-02    per Dr. Victorino Dike, diverticulosis only, repeat in 10  yrs     Family History  Problem Relation Age of Onset  . Pancreatic cancer Sister   . Colon cancer Neg Hx     History   Social History  . Marital Status: Widowed    Spouse Name: N/A    Number of Children: N/A  . Years of Education: N/A   Occupational History  . Not on file.   Social History Main Topics  . Smoking status: Never Smoker   . Smokeless tobacco: Never Used  . Alcohol Use: No  . Drug Use: No  . Sexual Activity: Not on file   Other Topics Concern  . Worked in [employer] in the distant past   Social History Narrative  . No narrative on file  History  Smoking status  . Never Smoker   Smokeless tobacco  . Never Used    History  Alcohol Use No     No Known Allergies  Prescriptions prior to admission  Medication Sig Dispense Refill  . aspirin EC 81 MG tablet Take 81 mg by mouth every Monday, Wednesday, and Friday.      . docusate sodium (COLACE) 100 MG capsule Take 100 mg by mouth every evening.      [name] glipiZIDE (GLUCOTROL) 10 MG tablet Take 1 tablet (10 mg total) by mouth 2 (two) times daily before a meal.  180 tablet  2  . lisinopril (PRINIVIL,ZESTRIL) 20 MG tablet Take 1 tablet (20 mg total) by mouth daily.  90 tablet  2  . metFORMIN (GLUCOPHAGE) 500 MG tablet Take 500 mg by mouth 2 (two) times daily with a meal.      . naproxen (NAPROSYN) 500 MG tablet Take 1 tablet (500 mg total) by mouth 2 (two) times daily with a meal.  180 tablet  3  . OVER THE COUNTER MEDICATION Apply 1 application topically daily as needed (dry skin and itching). Keri Lotion      . Skin Protectants, Misc. (EUCERIN) cream Apply 1  application topically daily as needed for dry skin.       [name] triamcinolone cream (KENALOG) 0.1 % Apply 1 application topically daily as needed (Rash).       . Vitamin D, Ergocalciferol, (DRISDOL) 50000 UNITS CAPS capsule Take 50,000 Units by mouth every 7 (seven) days. Take on Saturday      . ibuprofen (ADVIL,MOTRIN) 200 MG tablet Take 200 mg by mouth 2 (two) times daily as needed (pain).         Current Facility-Administered Medications  Medication Dose Route Frequency Provider Last Rate Last Dose  . 0.9 %  sodium chloride infusion  250 mL Intravenous PRN Vesta Mixer, MD      . 0.9 %  sodium chloride infusion   Intravenous Continuous Vesta Mixer, MD 75 mL/hr at 04/06/13 [phone]    . 0.9 %  sodium chloride infusion   Intravenous Continuous Vesta Mixer, MD      . acetaminophen (TYLENOL) tablet 650 mg  650 mg Oral Q4H PRN Vesta Mixer, MD      . aspirin chewable tablet 81 mg  81 mg Oral Pre-Cath Vesta Mixer, MD      . ondansetron [facility]) injection 4 mg  4 mg Intravenous Q6H PRN Vesta Mixer, MD      . sodium chloride 0.9 % injection 3 mL  3 mL Intravenous Q12H Vesta Mixer, MD      . sodium chloride 0.9 % injection 3 mL  3 mL Intravenous PRN Vesta Mixer, MD       STS for CABG Mortality 7.7 morbitity or mortality 35 Long los 23 Stroke   1.8 Prolonged ventalation 25 Renal failure 11 Reoperation 11     Review of Systems:     Cardiac Review of Systems: Y or N  Chest Pain [   y ]  Resting SOB [  n ] Exertional SOB  [ y ]  Orthopnea [ y ]   Pedal Edema [ y  ]    Palpitations [ n ] Syncope  [n  ]   Presyncope [ n  ]  General Review of Systems: [Y] = yes [  ]=no Constitional: recent weight change [n  ];  Wt loss over  the last 3 months [   ] anorexia [  ]; fatigue [name]  ]; nausea [ n ]; night sweats [  ]; fever [ n ]; or chills [ n ];          Dental: poor dentition[  ]; Last Dentist visit:   Eye : blurred vision [  ]; diplopia [   ]; vision changes [  ];  Amaurosis  fugax[n  ]; Resp: cough [  ];  wheezing[  ];  hemoptysis[  ]; shortness of breath[  ]; paroxysmal nocturnal dyspnea[ y ]; dyspnea on exertion[ y ]; or orthopnea[  ];  GI:  gallstones[  ], vomiting[  ];  dysphagia[  ]; melena[  ];  hematochezia [  ]; heartburn[  ];   Hx of  Colonoscopy[  ]; GU: kidney stones [  ]; hematuria[  ];   dysuria [ y ];  nocturia[  ];  history of     obstruction [ y ]; urinary frequency [name]  ]             Skin: rash, swelling[  ];, hair loss[  ];  peripheral edema[  ];  or itching[  ]; Musculosketetal: myalgias[  ];  joint swelling[  y];  joint erythema[ y ];  joint pain[ y ];  back pain[ y ];  Heme/Lymph: bruising[  ];  bleeding[  ];  anemia[  ];  Neuro: TIA[ n ];  headaches[  ];  stroke[  ];  vertigo[  ];  seizures[  ];   paresthesias[  ];  difficulty walking[  y];  Psych:depression[ y ]; anxiety[  ];  Endocrine: diabetes[ y ];  thyroid dysfunction[  ];  Immunizations: Flu up to date [name]  ]; Pneumococcal up to date [  ];  Other:  Physical Exam: BP 143/74  Pulse 60  Temp(Src) 98 F (36.7 C) (Oral)  Resp 18  Ht 5\' 10"  (1.778 m)  Wt 244 lb (110.678 kg)  BMI 35.01 kg/m2  SpO2 99%    General appearance: alert, cooperative, appears stated age and no distress Neurologic: intact Heart: regular rate and rhythm, S1, S2 normal, no murmur, click, rub or gallop Lungs: diminished breath sounds bilaterally Abdomen: soft, non-tender; bowel sounds normal; no masses,  no organomegaly and moderate obesity Extremities: extremities normal, atraumatic, no cyanosis or edema, Homans sign is negative, no sign of DVT, no ulcers, gangrene or trophic changes and varicose veins noted no carotis bruits, rt groin cath site stable   Diagnostic Studies & Laboratory data:     Recent Radiology Findings:  CLINICAL DATA: Chest pain and weakness  EXAM:  CHEST 2 VIEW  COMPARISON: Prior chest x-ray 07/12/2012  FINDINGS:  Very low inspiratory volumes, similar to prior exams. Chronic    elevation of the right hemidiaphragm. Stable cardiomegaly.  Atherosclerotic calcifications noted in the transverse aorta.  Advanced bilateral pleural parenchymal and interstitial chronic lung  disease appears slightly more prominent than previously seen. No  pleural effusion. No pneumothorax. Extensive bridging anterior  syndesmotic fight throughout the spine highly suggestive of  ankylosing spondylitis. .  IMPRESSION:  Subtly increased prominence of the interstitial markings  superimposed on the background of chronic lung changes may reflect  mild edema, or exacerbation of the patient's underlying interstitial  pneumonitis.  Skeletal findings suggest ankylosing spondylitis.  Electronically Signed  By: Malachy Moan M.D.  On: 02/06/2013 23:02     Recent Lab Findings: Lab Results  Component Value Date   WBC 9.4 04/05/2013  HGB 12.1* 04/05/2013   HCT 36.0* 04/05/2013   PLT 205 04/05/2013   GLUCOSE 145* 04/05/2013   CHOL 157 06/29/2012   TRIG 91.0 06/29/2012   HDL 33.00* 06/29/2012   LDLCALC 106* 06/29/2012   ALT 16 02/06/2013   AST 15 02/06/2013   NA 141 04/05/2013   K 4.0 04/05/2013   CL 104 04/05/2013   CREATININE 1.07 04/05/2013   BUN 22 04/05/2013   CO2 26 04/05/2013   TSH 1.79 06/29/2012   INR 1.01 04/05/2013   HGBA1C 7.9* 01/02/2013   CATH: Hemodynamics:  LV pressure: 153/19  Aortic pressure: 149/70  Angiography  Left Main: calcified, 30 % distal stenosis  Left anterior Descending: heavily calcified. 50-60% stenosis at the origin. 80%-90% after the take off of 1st diag. Occluded after the 2nd diag. The 1st diag is a branching vessel. The superior branch is occluded, The inferior branch is small and severely diseased. The 2nd diag is a small vessel.  Left Circumflex: the LCx is calcified and is not visualized. It is occluded at its takeoff.  Right Coronary Artery: large and dominant. Moderate - heavily calcified. Mild - moderate diffuse disease. The PDS has moderate disease.  The PLSA is small and diffusely diseased and has and 80% in the proximal segment.  LV Gram: moderate LV dysfunction. EF 40%.  Complications: No apparent complications  Patient did tolerate procedure well.  Contrast used: 90cc  Conclusions:  1. Severe 3 V CAD. Will get an opinion from [facility]. He would be a high risk PCI. Will discuss with family and patient.  Vesta Mixer, Montez Hageman., MD, [facility]  04/06/2013, 10:29 AM  Office - [phone]  Pager [phone]    Assessment / Plan:   3 vessel CAD with depressed lv function. The lad likely not bypassable due to poor target. Patient due to age, poor targets, pulmonary fibrosis, general health decline manifest by recurrent UTI with ? Urosepsis, progressive unsteady gait, mod pulmonary hyertension all make patient a high risk of post op complications and limit ability to return to full function The patient nor his daughter wish to proceed with CABG which I concur with.  Sleep apnea,  Obstructive;  Pulmonary fibrosis;  Hypothyroidism;  Diabetes mellitus  Depression;  Hyperlipidemia;  Hypertension;  Vitamin D deficiency  Erectile dysfunction;  PVD (peripheral vascular disease);  Osteoarthritis, generalized; Arthritis;  Unsteady gait;  Vertebral compression fracture;  BPH with urinary obstruction;  Diabetes mellitus type II, uncontrolled;  Bell's palsy.     I spent 55 minutes counseling the patient face to face. The total time spent in the appointment was 60 minutes.  Delight Ovens MD      [address] [place] [place] [address] Office [phone]   Beeper [phone]  04/06/2013 1:31 PM

## 2013-04-06 NOTE — Interval H&P Note (Signed)
History and Physical Interval Note:  04/06/2013 9:39 AM  [name]  has presented today for surgery, with the diagnosis of abnormal stress/cp  The various methods of treatment have been discussed with the patient and family. After consideration of risks, benefits and other options for treatment, the patient has consented to  Procedure(s): LEFT HEART CATHETERIZATION WITH CORONARY ANGIOGRAM (N/A) as a surgical intervention .  The patient's history has been reviewed, patient examined, no change in status, stable for surgery.  I have reviewed the patient's chart and labs.  Questions were answered to the patient's satisfaction.     Cath Lab Visit (complete for each Cath Lab visit)  Clinical Evaluation Leading to the Procedure:   ACS: no  Non-ACS:    Anginal Classification: CCS III  Anti-ischemic medical therapy: No Therapy  Non-Invasive Test Results: High-risk stress test findings: cardiac mortality >3%/year  Prior CABG: No previous CABG        Elyn Aquas.

## 2013-04-06 NOTE — Progress Notes (Signed)
  Echocardiogram 2D Echocardiogram has been performed.  Jon Fields [name] 04/06/2013, 3:32 PM

## 2013-04-06 NOTE — CV Procedure (Signed)
     Cardiac Cath Note  Jon Fields [id number] 06/27/1926  Procedure: left  Heart Cardiac Catheterization Note Indications:  CHF, chest discomfort  Procedure Details Consent: Obtained Time Out: Verified patient identification, verified procedure, site/side was marked, verified correct patient position, special equipment/implants available, Radiology Safety Procedures followed,  medications/allergies/relevent history reviewed, required imaging and test results available.  Performed   Medications: Fentanyl: 50 mcg iv  Versed:  2 mg IV   The right femoral artery was easily canulated using a modified Seldinger technique.  Hemodynamics:   LV pressure: 153/19 Aortic pressure: 149/70  Angiography   Left Main: calcified,  30 % distal stenosis  Left anterior Descending: heavily  calcified.  50-60% stenosis at the origin.  80%-90% after the take off of 1st diag.  Occluded after the 2nd diag. The 1st diag is a branching vessel.  The superior branch is occluded,  The inferior branch is small and severely diseased.  The 2nd diag is a small vessel.   Left Circumflex: the LCx is calcified and is not visualized.  It is occluded at its takeoff.   Right Coronary Artery: large and dominant.  Moderate - heavily calcified.  Mild - moderate diffuse disease.   The PDS has moderate disease.  The PLSA is small and diffusely diseased and  has and 80% in the proximal segment.    LV Gram: moderate LV dysfunction.  EF 40%.   Complications: No apparent complications Patient did tolerate procedure well.  Contrast used: 90cc   Conclusions:   1. Severe 3 V CAD.  Will get an opinion from [facility].  He would be a high risk PCI.  Will discuss with family and patient.     Vesta Mixer, Montez Hageman., MD, [facility] 04/06/2013, 10:29 AM Office - [phone] Pager [phone]

## 2013-04-06 NOTE — H&P (Signed)
Jon Fields Date of Birth              02-Dec-1926 [facility]                                                  [place] [facility]   [address]    [address]                         [address] [place], [place]  [address]                                           [place], [place]  [address] [phone]              Fax  [phone]                 [phone]  Fax [phone]   Problem list: 1. History of pulmonary fibrosis 2. Diabetes Mellitus, 3. hypertension 4. Hypothyroidism 5. Chronic UTI   History of Present Illness:   Jon Fields is an 79 yo with hx of pulmonary fibrosis.  He has rare episodes of chest pains. He because of his pulmonary fibrosis he has limited exertional capacity. He's had a normal stress test a year ago. His chest pains are very atypical. He had one episode of chest pain that occurred with rest. He does not have any chest discomfort with exertion.   Nov. 13, 2014:  2 weeks ago he had an episode of CP - lasted for 10-15 minutes.  Off and on for a week.   He has chronic UTIs.  His medical doctor has requested a stress myoview.  He had a stress myoview in march 2012 - reportedly normal.       Current Outpatient Prescriptions on File Prior to Visit   Medication  Sig  Dispense  Refill   .  acetaminophen (TYLENOL) 500 MG tablet  Take 500 mg by mouth every 6 (six) hours as needed for pain or fever.         [name]  aspirin 81 MG tablet  Take 81 mg by mouth 3 (three) times a week. Mon, Wed Fri         .  glipiZIDE (GLUCOTROL) 10 MG tablet  Take 1 tablet (10 mg total) by mouth 2 (two) times daily before a meal.   180 tablet   2   .  ibuprofen (ADVIL,MOTRIN) 200 MG tablet  Take 200 mg by mouth every 6 (six) hours as needed for pain.         [name]  lisinopril (PRINIVIL,ZESTRIL) 20 MG tablet  Take 1 tablet (20 mg total) by mouth daily.   90 tablet   2   .  metFORMIN (GLUCOPHAGE) 500 MG tablet  Take 500 mg by mouth 2 (two) times daily with a meal.          .  naproxen (NAPROSYN) 500 MG tablet  Take 1 tablet (500 mg total) by mouth 2 (two) times daily with a meal.   180 tablet   3   .  sertraline (ZOLOFT) 50 MG tablet  Take 1 tablet (50 mg total) by mouth daily.  30 tablet   5   .  traMADol (ULTRAM) 50 MG tablet  Take 2 tablets (100 mg total) by mouth every 6 (six) hours as needed for pain.   60 tablet   5   .  Vitamin D, Ergocalciferol, (DRISDOL) 50000 UNITS CAPS capsule  Take 1 capsule (50,000 Units total) by mouth every 7 (seven) days.   12 capsule   3      No current facility-administered medications on file prior to visit.    No Known Allergies    Past Medical History   Diagnosis  Date   .  Sleep apnea, obstructive         sees Dr. Marcelyn Bruins, had sleep study 12-27-07   .  Pulmonary fibrosis         sees Dr. Shelle Iron    .  Hypothyroidism     .  Diabetes mellitus         type 2   .  Depression     .  Hyperlipidemia     .  Hypertension     .  Vitamin D deficiency     .  Erectile dysfunction     .  PVD (peripheral vascular disease)     .  Osteoarthritis, generalized     .  Arthritis     .  Unsteady gait     .  Vertebral compression fracture     .  BPH with urinary obstruction         sees Dr. Patsi Sears   .  Diabetes mellitus type II, uncontrolled     .  Bell's palsy         2013    Past Surgical History   Procedure  Laterality  Date   .  Cholecystectomy       .  Appendectomy       .  Transurethral resection of prostate    May 2012       per Dr. Patsi Sears   .  Colonoscopy    06-12-02       per Dr. Victorino Dike, diverticulosis only, repeat in 10  yrs        History   Smoking status   .  Never Smoker    Smokeless tobacco   .  Never Used   History   Alcohol Use  No   Family History   Problem  Relation  Age of Onset   .  Pancreatic cancer  Sister     .  Colon cancer  Neg Hx      Reviw of Systems:   Reviewed in the HPI.  All other systems are negative.   Physical Exam: Blood pressure 110/62, pulse 80,  height 5\' 10"  (1.778 m), weight 237 lb (107.502 kg). General: Well developed, well nourished, in no acute distress.   Head: Normocephalic, atraumatic, sclera non-icteric, mucus membranes are moist,  Neck: Supple. Carotids are 2 + without bruits. No JVD Lungs: He has fine rales in both bases. He has decreased breath sounds in the left base. Heart: regular rate.  normal  S1 S2. No murmurs, gallops or rubs. Abdomen: Soft, non-tender, non-distended with normal bowel sounds. No hepatomegaly. No rebound/guarding. No masses. Msk:  Strength and tone are normal Extremities: No clubbing or cyanosis. No edema.  Distal pedal pulses are 2+ and equal bilaterally. Neuro: Alert and oriented X 3. Moves all extremities spontaneously. Psych:  Responds to questions appropriately with a normal affect.  ECG: Nov.  13, 2014:  NSR wth 1st degree AV block, RBBB, LAFS, LVH with repol    Assessment / Plan:  Chest discomfort -      Jon Fields presents for further evaluation of some episodes of chest discormort.  His symptoms are somewhat concerning.  He is not able to ambulate very well and has significant ECG abnormalities.   The myoview revealed significant abnormalities:.  Overall Impression: High risk stress nuclear study. There is a medium size, moderate severity partially reversible defect involving the mid inferolateral myocardium. There is also a medium size, moderate severity reversible defect involving the apical myocardium. There is LV systolic dysfunction with EF 37%  LV Ejection Fraction: 37%. LV Wall Motion: Global hypokinesis   We have scheduled him for cath. He understands the risks / benefits / and options.     Vesta Mixer, Montez Hageman., MD, [facility] 04/06/2013, 8:52 AM Office - [phone] Pager [phone]

## 2013-04-09 NOTE — Progress Notes (Signed)
Needs cabg

## 2013-04-12 ENCOUNTER — Telehealth: Payer: Self-pay | Admitting: Cardiovascular Disease

## 2013-04-12 DIAGNOSIS — I2510 Atherosclerotic heart disease of native coronary artery without angina pectoris: Secondary | ICD-10-CM

## 2013-04-12 DIAGNOSIS — R079 Chest pain, unspecified: Secondary | ICD-10-CM

## 2013-04-12 NOTE — Telephone Encounter (Signed)
I will forward to Dr Elease Hashimoto. I do not see return visit in plan of care.

## 2013-04-12 NOTE — Telephone Encounter (Signed)
New message    An appt was scheduled for a cath f/u in jan but daughter thinks her dad should see the doctor sooner.  The discharge summary does not state when pt is to be seen.  She said he did not get stents but has blocked arteries.  Pls let daughter know when Dr Elease Hashimoto want to see pt.

## 2013-04-13 NOTE — Telephone Encounter (Signed)
I have called [facility] to see what their plan is .  Alycia Rossetti will call me back today

## 2013-04-13 NOTE — Telephone Encounter (Signed)
Vesta Mixer, MD Antony Odea, RN Cc: Peter M Swaziland, MD; Charna Elizabeth, LPN            Mr. Shiraishi has 3 V CAD but doe not want ( and is a poor candidate) for CABG.   Dr. Swaziland offered to see him and try high risk PCI. Will you see if we can work him in to see Dr. Swaziland - perhaps he has a cath day next week.   We can use my H&P from last week.   Michele Mcalpine

## 2013-04-13 NOTE — Telephone Encounter (Signed)
Explained PCI  to daughter/ she will discuss with her father. She will call back to let us know if pt would like an appointment with dr Swaziland in the office first or if he  would like to proceed with [facility] w/pci and discuss his case prior to procedure.  They will call Monday to inform us.

## 2013-04-13 NOTE — Telephone Encounter (Signed)
Informed daughter with update, will call her back Monday

## 2013-04-13 NOTE — Telephone Encounter (Signed)
Patient called no answer.Left message on personal voice mail received message from Dr.Nahser to schedule cardiac cath with Dr.Jordan next week.Advised to call me back to schedule.

## 2013-04-16 ENCOUNTER — Telehealth: Payer: Self-pay | Admitting: Cardiovascular Disease

## 2013-04-16 ENCOUNTER — Encounter: Payer: Self-pay | Admitting: *Deleted

## 2013-04-16 ENCOUNTER — Encounter: Payer: Medicare Other | Admitting: Nurse Practitioner

## 2013-04-16 NOTE — Telephone Encounter (Signed)
Follow Up:  Pt's daughter states per her and Jodette's  phone conversation on Friday. Her father wants appt w/ Dr. Swaziland...  However, Dr. Elease Hashimoto called her and said he did not know if Dr. Swaziland would do the stiniting. Pt's daughter is requesting a call back from Jodette to discuss options.

## 2013-04-16 NOTE — Telephone Encounter (Signed)
Pt daughter informed we are waiting for an app date/time to be seen, we will call as soon as we hear. Daughter informed.

## 2013-04-16 NOTE — Telephone Encounter (Signed)
PT states he will not keep Thursday or Friday app till he speaks with Dr Elease Hashimoto. Pt does not remember the results/ outcome from last heart cath from last week. Pt was told that Dr Elease Hashimoto will call him back. Pt agreed to plan.

## 2013-04-16 NOTE — Telephone Encounter (Signed)
Lab date given/ cath app, app w/ dr Swaziland, daughter informed. See letter. Pt was told to hold metformin Thursday.

## 2013-04-16 NOTE — Telephone Encounter (Signed)
New problem:  The pt, Jon Fields, states he is returning a call from the nurse. Pt would like a call back.

## 2013-04-17 NOTE — Telephone Encounter (Signed)
I have called [name] twice and his daughter once.  I left a message on 12/15 with my cell number for him to call back and did not receive any response.

## 2013-04-18 NOTE — Telephone Encounter (Signed)
Follow up   Pt's son Onalee Hua needs a call back please.  [phone].

## 2013-04-18 NOTE — Telephone Encounter (Signed)
Follow up    Pt's son needs a call back to confirm pt's 12/19 CATH appt.   Please thank you!

## 2013-04-18 NOTE — Telephone Encounter (Signed)
Pt will keep OV with Dr Swaziland to discuss PCI for Friday 12/19.

## 2013-04-18 NOTE — Telephone Encounter (Signed)
Follow up    Pt 's son called back to state they will keep the appt 12/18 @ 4 pm.

## 2013-04-18 NOTE — Telephone Encounter (Signed)
Chart reviewed/ phone conversation lasted for 45 minutes. Dr Elease Hashimoto called pt yesterday and prior to that and left call back number. I reviewed with son/ Onalee Hua and they were calling the wrong number back. I reviewed last 2 stress tests/ echo/ heart cath/ and the fact of pt not being a candidate for CABG with Isabell Jarvis has a good understanding of his fathers positive stress test results and the blockages seen on the [facility].  Pt is symptomatic with cp and SOB.  Onalee Hua will convey the need to meet tomorrow with Dr Swaziland @ 4pm so PCI can be discussed. Onalee Hua was appreciative and will call back if his father decides not to proceed with OV/ cath.  Dr Elease Hashimoto was made aware of conversation.

## 2013-04-19 ENCOUNTER — Encounter: Payer: Self-pay | Admitting: Cardiology

## 2013-04-19 ENCOUNTER — Ambulatory Visit (INDEPENDENT_AMBULATORY_CARE_PROVIDER_SITE_OTHER): Payer: Medicare Other | Admitting: Cardiology

## 2013-04-19 ENCOUNTER — Other Ambulatory Visit: Payer: Medicare Other

## 2013-04-19 VITALS — BP 156/90 | HR 86 | Ht 70.0 in | Wt 244.8 lb

## 2013-04-19 DIAGNOSIS — I2510 Atherosclerotic heart disease of native coronary artery without angina pectoris: Secondary | ICD-10-CM

## 2013-04-19 DIAGNOSIS — I10 Essential (primary) hypertension: Secondary | ICD-10-CM

## 2013-04-19 DIAGNOSIS — R0609 Other forms of dyspnea: Secondary | ICD-10-CM

## 2013-04-19 DIAGNOSIS — R0989 Other specified symptoms and signs involving the circulatory and respiratory systems: Secondary | ICD-10-CM

## 2013-04-19 DIAGNOSIS — IMO0001 Reserved for inherently not codable concepts without codable children: Secondary | ICD-10-CM

## 2013-04-19 DIAGNOSIS — E1165 Type 2 diabetes mellitus with hyperglycemia: Secondary | ICD-10-CM

## 2013-04-19 MED ORDER — ISOSORBIDE MONONITRATE ER 30 MG PO TB24: 30.0000 mg | ORAL_TABLET | Freq: Every day | ORAL | Status: DC

## 2013-04-19 MED ORDER — NITROGLYCERIN 0.4 MG SL SUBL: 0.4000 mg | SUBLINGUAL_TABLET | SUBLINGUAL | Status: DC | PRN

## 2013-04-19 MED ORDER — BISOPROLOL FUMARATE 5 MG PO TABS: 5.0000 mg | ORAL_TABLET | Freq: Every day | ORAL | Status: DC

## 2013-04-19 NOTE — Patient Instructions (Signed)
Start bisoprolol 5 mg daily  Start isosorbide 30 mg daily  Use nitroglycerin sublingual as needed for chest pain   I will schedule you for a follow up visit with Dr. Elease Hashimoto in one month.  Take ASA daily

## 2013-04-20 ENCOUNTER — Ambulatory Visit (HOSPITAL_COMMUNITY): Admission: RE | Admit: 2013-04-20 | Payer: Medicare Other | Source: Ambulatory Visit | Admitting: Cardiology

## 2013-04-20 ENCOUNTER — Encounter (HOSPITAL_COMMUNITY): Admission: RE | Payer: Self-pay | Source: Ambulatory Visit

## 2013-04-20 SURGERY — PERCUTANEOUS CORONARY STENT INTERVENTION (PCI-S)
Anesthesia: LOCAL

## 2013-04-20 NOTE — Progress Notes (Signed)
Jon Fields Date of Birth: 1926/08/25 Medical Record #[id number]  History of Present Illness: Jon Fields is seen at the request of Dr. Elease Hashimoto for consideration of potential PCI for CAD. He is seen with his son today. He is an 79 year old white male with history of pulmonary fibrosis, diabetes mellitus, hypertension, and hypothyroidism. He has chronic symptoms of shortness of breath. He states for several months that he has had no energy. In October he reports an emergency room visit for evaluation of chest pain. His initial evaluation was unremarkable. He had a prior stress Myoview study March of 2012 which was normal. He subsequently had a stress Myoview study on 03/15/2013. This was interpreted as a high risk study with medium-sized reversible defects involving the mid inferior lateral wall and apical wall segments. Ejection fraction 37%. An echocardiogram was obtained and demonstrated an ejection fraction of 50-55% with no regional wall motion abnormality. There was moderate tricuspid insufficiency and moderate pulmonary hypertension. Patient does report a 2-3 month history of substernal chest pain. He states this feels like he is bloated in his chest and uncomfortable. Initially it was occurring once a week but is now occurring on a daily basis. It is worse with exertion such as walking to his car and is relieved with rest. Typically lasts less than 5 minutes. He has not used any nitroglycerin. His lifestyle is very sedentary. Following his stress test he underwent cardiac catheterization which was performed on 04/06/2013. I reviewed these findings with the family today. The left main coronary was heavily calcified but had no obstructive disease. The LAD was severely calcified. There is a moderate stenosis in the mid LAD at the takeoff of the first diagonal. The LAD is occluded distally. There are bridging collaterals. The first diagonal is a small branch and is subtotally occluded. There are 2  ramus branches. The first branch has a segmental 80-90% stenosis proximally. The second branch is smaller and has moderate disease. Left circumflex is occluded. The right coronary is a large dominant vessel. There is diffuse distal disease in the posterior lateral branches. The patient was seen by Dr. Tyrone Sage for consideration of bypass surgery but he was felt to be poor candidate because of his comorbidities and poor target vessels. He currently is on no antianginal therapy.  Current Outpatient Prescriptions on File Prior to Visit  Medication Sig Dispense Refill  . aspirin EC 81 MG tablet Take 81 mg by mouth every Monday, Wednesday, and Friday.      . docusate sodium (COLACE) 100 MG capsule Take 100 mg by mouth every evening.      [name] glipiZIDE (GLUCOTROL) 10 MG tablet Take 1 tablet (10 mg total) by mouth 2 (two) times daily before a meal.  180 tablet  2  . ibuprofen (ADVIL,MOTRIN) 200 MG tablet Take 200 mg by mouth 2 (two) times daily as needed (pain).       [name] lisinopril (PRINIVIL,ZESTRIL) 20 MG tablet Take 1 tablet (20 mg total) by mouth daily.  90 tablet  2  . metFORMIN (GLUCOPHAGE) 500 MG tablet Take 500 mg by mouth 2 (two) times daily with a meal.      . naproxen (NAPROSYN) 500 MG tablet Take 1 tablet (500 mg total) by mouth 2 (two) times daily with a meal.  180 tablet  3  . OVER THE COUNTER MEDICATION Apply 1 application topically daily as needed (dry skin and itching). Keri Lotion      . Skin Protectants, Misc. (EUCERIN) cream Apply  1 application topically daily as needed for dry skin.       [name] triamcinolone cream (KENALOG) 0.1 % Apply 1 application topically daily as needed (Rash).       . Vitamin D, Ergocalciferol, (DRISDOL) 50000 UNITS CAPS capsule Take 50,000 Units by mouth every 7 (seven) days. Take on Saturday       No current facility-administered medications on file prior to visit.    No Known Allergies  Past Medical History  Diagnosis Date  . Sleep apnea, obstructive     sees  Dr. Marcelyn Bruins, had sleep study 12-27-07  . Pulmonary fibrosis     sees Dr. Shelle Iron   . Hypothyroidism   . Diabetes mellitus     type 2  . Depression   . Hyperlipidemia   . Hypertension   . Vitamin D deficiency   . Erectile dysfunction   . PVD (peripheral vascular disease)   . Osteoarthritis, generalized   . Arthritis   . Unsteady gait   . Vertebral compression fracture   . BPH with urinary obstruction     sees Dr. Patsi Sears  . Diabetes mellitus type II, uncontrolled   . Bell's palsy     2013  . CAD (coronary artery disease)     Past Surgical History  Procedure Laterality Date  . Cholecystectomy    . Appendectomy    . Transurethral resection of prostate  May 2012    per Dr. Patsi Sears  . Colonoscopy  06-12-02    per Dr. Victorino Dike, diverticulosis only, repeat in 10  yrs     History  Smoking status  . Never Smoker   Smokeless tobacco  . Never Used    History  Alcohol Use No    Family History  Problem Relation Age of Onset  . Pancreatic cancer Sister   . Colon cancer Neg Hx     Review of Systems: As noted in history of present illness.  All other systems were reviewed and are negative.  Physical Exam: BP 156/90  Pulse 86  Ht 5\' 10"  (1.778 m)  Wt 244 lb 12 oz (111.018 kg)  BMI 35.12 kg/m2 He is an elderly, obese white male in no acute distress.   HEENT exam: Normocephalic, atraumatic. Pupils are equal round and reactive to light accommodation. Oropharynx is clear with good dental repair. Neck: No JVD, adenopathy, thyromegaly, or bruits. Lungs: Diffuse dry crackles. Cardiovascular: Regular rate and rhythm. Normal S1 and S2. Soft grade 1/6 systolic murmur at the left sternal border. No gallop. Abdomen: Obese, soft, nontender. No masses or hepatosplenomegaly. Extremities: No cyanosis. Trace edema. Pedal pulses are 2+. Skin: Warm and dry Neuro: Alert and oriented x3. Cranial nerves II through XII are intact. No focal findings.   LABORATORY DATA: Lab  Results  Component Value Date   WBC 9.4 04/05/2013   HGB 12.1* 04/05/2013   HCT 36.0* 04/05/2013   PLT 205 04/05/2013   GLUCOSE 145* 04/05/2013   CHOL 157 06/29/2012   TRIG 91.0 06/29/2012   HDL 33.00* 06/29/2012   LDLCALC 106* 06/29/2012   ALT 16 02/06/2013   AST 15 02/06/2013   NA 141 04/05/2013   K 4.0 04/05/2013   CL 104 04/05/2013   CREATININE 1.07 04/05/2013   BUN 22 04/05/2013   CO2 26 04/05/2013   TSH 1.79 06/29/2012   PSA 0.46 06/29/2012   INR 1.01 04/05/2013   HGBA1C 7.9* 01/02/2013   MICROALBUR 6.9* 06/29/2012     Assessment / Plan:  1.  Coronary disease with class III angina. Patient has complex disease based on cardiac catheterization. The left circumflex is occluded. The distal LAD is occluded. There is a small diagonal that is occluded. He does have moderate to severe disease in the mid LAD and in a ramus branch. LV function is moderately reduced by Myoview study and cardiac catheterization. I agree that he is not a surgical candidate based on his comorbidities and poor targets. For similar reasons I think that he is not an ideal candidate for PCI. I think the only targets suitable for PCI would be the mid LAD and possibly the ramus branch. Stenting the ramus branch may compromise the smaller side branch. The indication for PCI would be refractory anginal symptoms despite optimal medical therapy. He is currently on no antianginal therapy. PCI would not be expected to improve his mortality or decrease his risk of myocardial infarction. I would not expect PCI of these vessels to significantly improve his LV function. For this reason I recommended initiation of antianginal therapy. We'll start him on isosorbide mononitrate 30 mg per day. We will start bisoprolol 5 mg daily. He is given a prescription for sublingual nitroglycerin and instructed  in its use. Patient will followup with Dr. Elease Hashimoto in 3 weeks and have further adjustment in his antianginal therapy. I expect that with medical therapy alone  we will see significant improvement in his anginal classification. 2. Pulmonary fibrosis 3. Diabetes mellitus type 2 4. Hypertension 5. Obesity

## 2013-04-23 ENCOUNTER — Telehealth: Payer: Self-pay | Admitting: Family Medicine

## 2013-04-23 MED ORDER — AMOXICILLIN-POT CLAVULANATE 875-125 MG PO TABS: 1.0000 | ORAL_TABLET | Freq: Two times a day (BID) | ORAL | Status: DC

## 2013-04-23 NOTE — Telephone Encounter (Signed)
Sorry but I am not able to see him today. I would recommend he see someone else. Otherwise call in Augmentin 875 bid for 10 days

## 2013-04-23 NOTE — Telephone Encounter (Signed)
Spoke with nurse at [facility] to advise pt should take augmentin 875mg  bid x 10 days

## 2013-04-23 NOTE — Telephone Encounter (Signed)
Pt has cough for more than a week. Thick yellowish phelm. Sore throat from cough. No appts. Pt would like to be seen asap.   Pt refused to see anyone else. Prefer today appt.  pls advise

## 2013-04-24 ENCOUNTER — Encounter (HOSPITAL_COMMUNITY): Payer: Self-pay | Admitting: Emergency Medicine

## 2013-04-24 ENCOUNTER — Telehealth: Payer: Self-pay | Admitting: Family Medicine

## 2013-04-24 ENCOUNTER — Telehealth: Payer: Self-pay | Admitting: Cardiovascular Disease

## 2013-04-24 ENCOUNTER — Emergency Department (HOSPITAL_COMMUNITY): Payer: Medicare Other

## 2013-04-24 ENCOUNTER — Emergency Department (HOSPITAL_COMMUNITY)
Admission: EM | Admit: 2013-04-24 | Discharge: 2013-04-24 | Disposition: A | Discharge status: Home / Self Care | Payer: Medicare Other | Attending: Emergency Medicine | Admitting: Emergency Medicine

## 2013-04-24 DIAGNOSIS — M159 Polyosteoarthritis, unspecified: Secondary | ICD-10-CM | POA: Insufficient documentation

## 2013-04-24 DIAGNOSIS — J209 Acute bronchitis, unspecified: Secondary | ICD-10-CM | POA: Insufficient documentation

## 2013-04-24 DIAGNOSIS — Z8659 Personal history of other mental and behavioral disorders: Secondary | ICD-10-CM | POA: Insufficient documentation

## 2013-04-24 DIAGNOSIS — Z87312 Personal history of (healed) stress fracture: Secondary | ICD-10-CM | POA: Insufficient documentation

## 2013-04-24 DIAGNOSIS — Z79899 Other long term (current) drug therapy: Secondary | ICD-10-CM | POA: Insufficient documentation

## 2013-04-24 DIAGNOSIS — Z791 Long term (current) use of non-steroidal anti-inflammatories (NSAID): Secondary | ICD-10-CM | POA: Insufficient documentation

## 2013-04-24 DIAGNOSIS — J40 Bronchitis, not specified as acute or chronic: Secondary | ICD-10-CM

## 2013-04-24 DIAGNOSIS — IMO0002 Reserved for concepts with insufficient information to code with codable children: Secondary | ICD-10-CM | POA: Insufficient documentation

## 2013-04-24 DIAGNOSIS — Z8669 Personal history of other diseases of the nervous system and sense organs: Secondary | ICD-10-CM | POA: Insufficient documentation

## 2013-04-24 DIAGNOSIS — Z87448 Personal history of other diseases of urinary system: Secondary | ICD-10-CM | POA: Insufficient documentation

## 2013-04-24 DIAGNOSIS — Z7982 Long term (current) use of aspirin: Secondary | ICD-10-CM | POA: Insufficient documentation

## 2013-04-24 DIAGNOSIS — Z792 Long term (current) use of antibiotics: Secondary | ICD-10-CM | POA: Insufficient documentation

## 2013-04-24 DIAGNOSIS — I10 Essential (primary) hypertension: Secondary | ICD-10-CM | POA: Insufficient documentation

## 2013-04-24 DIAGNOSIS — E559 Vitamin D deficiency, unspecified: Secondary | ICD-10-CM | POA: Insufficient documentation

## 2013-04-24 DIAGNOSIS — IMO0001 Reserved for inherently not codable concepts without codable children: Secondary | ICD-10-CM | POA: Insufficient documentation

## 2013-04-24 DIAGNOSIS — R040 Epistaxis: Secondary | ICD-10-CM | POA: Insufficient documentation

## 2013-04-24 DIAGNOSIS — I2510 Atherosclerotic heart disease of native coronary artery without angina pectoris: Secondary | ICD-10-CM | POA: Insufficient documentation

## 2013-04-24 MED ORDER — AMOXICILLIN-POT CLAVULANATE 875-125 MG PO TABS
1.0000 | ORAL_TABLET | Freq: Once | ORAL | Status: AC
  Administered 2013-04-24: 1 via ORAL
  Filled 2013-04-24: qty 1

## 2013-04-24 NOTE — Telephone Encounter (Signed)
Pt is going to ENT([facility]) today for nosebleeds. Pt daughter is aware MD out of office today and he may need a referral. Pt daughter will call the ins company to see does he needs a referral. Pt daughter stated they will kept appt with or without a referral. Pt daughter is aware if referral is needed he may not get maximum benefit allowed

## 2013-04-24 NOTE — ED Notes (Signed)
MD at bedside. 

## 2013-04-24 NOTE — ED Notes (Signed)
[facility] sent ride for pt to return to facility.  Pt experiencing epistaxis.  Will have physician check before pt leaves facility.

## 2013-04-24 NOTE — ED Notes (Signed)
Returned from [facility].  No change in status.  Continues to cough; voice hoarse.

## 2013-04-24 NOTE — ED Notes (Signed)
Pt feels bleeding has stopped "it's only a little bit"  And he wants to go back to [facility].  Declined repeat physician exam.  Discharged with [facility] to drive him home.

## 2013-04-24 NOTE — ED Notes (Signed)
Cough/cold symptoms x 10 days minimum.  Tonight had epistaxis from left nare x 2, took approx one minute to stop each.  Here to be checked out.

## 2013-04-24 NOTE — ED Notes (Signed)
Called to pt room.  Pt had tissues full of sputum/blood.  Denies nose bleed at this time. Attempted to check pharynx for stream of blood from nare.  Unable to visualize well.

## 2013-04-24 NOTE — ED Provider Notes (Signed)
CSN: [id number]     Arrival date & time 04/24/13  0153 History   First MD Initiated Contact with Patient 04/24/13 0211     Chief Complaint  Patient presents with  . Epistaxis  . URI   (Consider location/radiation/quality/duration/timing/severity/associated sxs/prior Treatment) HPI Comments: 79 yo male with DM, depression, hypothyroid presents with cough and epistaxis.  Pt has had non productive cough for over a week, started on augmentin yesterday but has not taken a dose yet.  This evening he had two episodes of bleeding from left nare, it has stopped.  No blood thinners. No fevers.  No recent hospitalizations or lung dz.  No chf hx.  Patient is a 79 y.o. male presenting with nosebleeds and URI. The history is provided by the patient.  Epistaxis Associated symptoms: congestion and cough   Associated symptoms: no fever and no headaches   URI Presenting symptoms: congestion and cough   Presenting symptoms: no fever   Associated symptoms: no headaches and no neck pain     Past Medical History  Diagnosis Date  . Sleep apnea, obstructive     sees Dr. Marcelyn Bruins, had sleep study 12-27-07  . Pulmonary fibrosis     sees Dr. Shelle Iron   . Hypothyroidism   . Diabetes mellitus     type 2  . Depression   . Hyperlipidemia   . Hypertension   . Vitamin D deficiency   . Erectile dysfunction   . PVD (peripheral vascular disease)   . Osteoarthritis, generalized   . Arthritis   . Unsteady gait   . Vertebral compression fracture   . BPH with urinary obstruction     sees Dr. Patsi Sears  . Diabetes mellitus type II, uncontrolled   . Bell's palsy     2013  . CAD (coronary artery disease)    Past Surgical History  Procedure Laterality Date  . Cholecystectomy    . Appendectomy    . Transurethral resection of prostate  May 2012    per Dr. Patsi Sears  . Colonoscopy  06-12-02    per Dr. Victorino Dike, diverticulosis only, repeat in 10  yrs    Family History  Problem Relation Age of Onset   . Pancreatic cancer Sister   . Colon cancer Neg Hx    History  Substance Use Topics  . Smoking status: Never Smoker   . Smokeless tobacco: Never Used  . Alcohol Use: No    Review of Systems  Constitutional: Negative for fever and chills.  HENT: Positive for congestion and nosebleeds.   Respiratory: Positive for cough. Negative for shortness of breath.   Cardiovascular: Negative for chest pain.  Gastrointestinal: Negative for vomiting and abdominal pain.  Genitourinary: Negative for dysuria and flank pain.  Musculoskeletal: Negative for back pain, neck pain and neck stiffness.  Skin: Negative for rash.  Neurological: Negative for light-headedness and headaches.    Allergies  Review of patient's allergies indicates no known allergies.  Home Medications   Current Outpatient Rx  Name  Route  Sig  Dispense  Refill  . aspirin EC 81 MG tablet   Oral   Take 81 mg by mouth every Monday, Wednesday, and Friday.         . bisoprolol (ZEBETA) 5 MG tablet   Oral   Take 1 tablet (5 mg total) by mouth daily.   30 tablet   6   . docusate sodium (COLACE) 100 MG capsule   Oral   Take 100 mg by mouth  every evening.         [name] glipiZIDE (GLUCOTROL) 10 MG tablet   Oral   Take 1 tablet (10 mg total) by mouth 2 (two) times daily before a meal.   180 tablet   2   . ibuprofen (ADVIL,MOTRIN) 200 MG tablet   Oral   Take 200 mg by mouth 2 (two) times daily as needed (pain).          . isosorbide mononitrate (IMDUR) 30 MG 24 hr tablet   Oral   Take 1 tablet (30 mg total) by mouth daily.   30 tablet   6   . lisinopril (PRINIVIL,ZESTRIL) 20 MG tablet   Oral   Take 1 tablet (20 mg total) by mouth daily.   90 tablet   2   . metFORMIN (GLUCOPHAGE) 500 MG tablet   Oral   Take 500 mg by mouth 2 (two) times daily with a meal.         . naproxen (NAPROSYN) 500 MG tablet   Oral   Take 1 tablet (500 mg total) by mouth 2 (two) times daily with a meal.   180 tablet   3   .  nitroGLYCERIN (NITROSTAT) 0.4 MG SL tablet   Sublingual   Place 1 tablet (0.4 mg total) under the tongue every 5 (five) minutes as needed for chest pain.   90 tablet   3   . OVER THE COUNTER MEDICATION   Topical   Apply 1 application topically daily as needed (dry skin and itching). Keri Lotion         . Skin Protectants, Misc. (EUCERIN) cream   Topical   Apply 1 application topically daily as needed for dry skin.          [name] triamcinolone cream (KENALOG) 0.1 %   Topical   Apply 1 application topically daily as needed (Rash).          . Vitamin D, Ergocalciferol, (DRISDOL) 50000 UNITS CAPS capsule   Oral   Take 50,000 Units by mouth every 7 (seven) days. Take on Saturday         . amoxicillin-clavulanate (AUGMENTIN) 875-125 MG per tablet   Oral   Take 1 tablet by mouth 2 (two) times daily.   20 tablet   0    BP 153/85  Pulse 66  Temp(Src) 98.7 F (37.1 C) (Oral)  Resp 20  Ht 5\' 10"  (1.778 m)  Wt 238 lb (107.956 kg)  BMI 34.15 kg/m2  SpO2 98% Physical Exam  Nursing note and vitals reviewed. Constitutional: He is oriented to person, place, and time. He appears well-developed and well-nourished.  HENT:  Head: Normocephalic and atraumatic.  Mild dried blood left nare, no active bleeding  Eyes: Conjunctivae are normal. Right eye exhibits no discharge. Left eye exhibits no discharge.  Neck: Normal range of motion. Neck supple. No tracheal deviation present.  Cardiovascular: Normal rate and regular rhythm.   Pulmonary/Chest: Effort normal and breath sounds normal.  Abdominal: Soft. He exhibits no distension. There is no tenderness. There is no guarding.  Musculoskeletal: He exhibits no tenderness.  Neurological: He is alert and oriented to person, place, and time.  Skin: Skin is warm. No rash noted.  Psychiatric: He has a normal mood and affect.    ED Course  Procedures (including critical care time) Labs Review Labs Reviewed - No data to display Imaging  Review Dg Chest 2 View  04/24/2013   CLINICAL DATA:  Epistaxis, URI  EXAM: CHEST  2 VIEW  COMPARISON:  Prior radiograph from 02/06/2013.  FINDINGS: Cardiomegaly is grossly stable as compared to the previous examination. Atherosclerotic calcifications again noted within the aortic arch.  The lungs are hypoinflated. No focal infiltrate to suggest acute infectious pneumonitis identified. Chronic lung changes are again seen without significant pulmonary edema. No pleural effusion or pneumothorax.  Osseous structures are unchanged. Findings consistent with ankylosing spondylitis again seen within the spine.  IMPRESSION: 1. Hypoinflation with no acute cardiopulmonary disease identified. 2. Findings consistent with ankylosing spondylitis.   Electronically Signed   By: Rise Mu M.D.   On: 04/24/2013 03:49    EKG Interpretation   None       MDM   1. Bronchitis   2. Epistaxis    Well appearing clinically bronchitis vs CAP.  Plan for cxr and po abx.  Close fup discussed.  No bleeding initially, pt had small amount on tissue on recheck, no active bleeding on recheck. Discussed blood work and next step would be posterior pack, pt agrees that we will give it 24-48 hrs and see how he does, reasons to return given.  Well appearing on dc.  Filed Vitals:   04/24/13 0400 04/24/13 0430 04/24/13 0445 04/24/13 0500  BP: 123/47 144/59 121/59 117/50  Pulse: 62 62 61 61  Temp:      TempSrc:      Resp:      Height:      Weight:      SpO2: 100% 98% 99% 100%     Results and differential diagnosis were discussed with the patient. Close follow up outpatient was discussed, patient comfortable with the plan.   Diagnosis: Bronchitis, Epistaxis    Enid Skeens, MD 04/24/13 [phone]

## 2013-04-24 NOTE — ED Notes (Signed)
Pt on phone attempting to arrange transportation home.

## 2013-04-24 NOTE — Telephone Encounter (Signed)
New message    Patient's daughter called in her father was started on new medications. He was in the ER last night due to a nose bleed. She just wants to make sure it is okay to continue his medications. Please call and advise,

## 2013-04-24 NOTE — ED Notes (Signed)
Dr Jodi Mourning discussed two options with pt re: nasal tampon or wait and see, return in 2 days if not better.  Pt appears more concerned with "violent coughing of phlegm."

## 2013-04-24 NOTE — Telephone Encounter (Signed)
Pt is not on any blood thinners/ pt has bronchitis and had a nose bleed. Told them to continue meds along with ABX. Start a humidifier for his room, nasal saline, Vaseline at entrance of nares, increase fluids and reduce nose blowing till nares are healed.  Told her to call with further questions/ daughter verbalized understanding.

## 2013-04-24 NOTE — ED Notes (Signed)
Contacted daughter per pt request to figure out how he is to get home.

## 2013-04-24 NOTE — ED Notes (Signed)
Pt concerned about "significant amount of blood" he is coughing up.  Bleeding appears mild.

## 2013-05-07 ENCOUNTER — Telehealth: Payer: Self-pay | Admitting: Family Medicine

## 2013-05-07 ENCOUNTER — Telehealth: Payer: Self-pay

## 2013-05-07 NOTE — Telephone Encounter (Signed)
Pt called and states he was given amoxicllin on 12/22 and he still has 6 pills left.  Pt would like to know if he should continue this medication or stop.  Pt wanted to speak with Dr. Sarajane Jews or Sunday Spillers.

## 2013-05-07 NOTE — Telephone Encounter (Signed)
Pt was rx amoxicillin-clavulanate (AUGMENTIN) 875-125 MG per tablet, only took for 5 days.  Daughter states she realized he had not been taking this for the past eight days  Would like to know if he may need another rx to be on this continuously for the 10 days? [facility] [address]

## 2013-05-07 NOTE — Telephone Encounter (Signed)
At this point it is too late to help, so just stop the antibiotic and follow up prn

## 2013-05-08 NOTE — Telephone Encounter (Signed)
I spoke with Tye Maryland and gave this information.

## 2013-05-17 ENCOUNTER — Encounter: Payer: Medicare Other | Admitting: Cardiovascular Disease

## 2013-05-22 ENCOUNTER — Encounter: Payer: Self-pay | Admitting: Family Medicine

## 2013-05-22 ENCOUNTER — Ambulatory Visit (INDEPENDENT_AMBULATORY_CARE_PROVIDER_SITE_OTHER): Payer: Medicare Other | Admitting: Family Medicine

## 2013-05-22 VITALS — BP 132/70 | HR 79 | Temp 99.6°F

## 2013-05-22 DIAGNOSIS — F3289 Other specified depressive episodes: Secondary | ICD-10-CM

## 2013-05-22 DIAGNOSIS — E119 Type 2 diabetes mellitus without complications: Secondary | ICD-10-CM

## 2013-05-22 DIAGNOSIS — F329 Major depressive disorder, single episode, unspecified: Secondary | ICD-10-CM

## 2013-05-22 DIAGNOSIS — J8410 Pulmonary fibrosis, unspecified: Secondary | ICD-10-CM

## 2013-05-22 DIAGNOSIS — J40 Bronchitis, not specified as acute or chronic: Secondary | ICD-10-CM

## 2013-05-22 DIAGNOSIS — I10 Essential (primary) hypertension: Secondary | ICD-10-CM

## 2013-05-22 MED ORDER — AZITHROMYCIN 250 MG PO TABS: ORAL_TABLET | ORAL | Status: DC

## 2013-05-22 NOTE — Progress Notes (Signed)
Pre visit review using our clinic review tool, if applicable. No additional management support is needed unless otherwise documented below in the visit note. 

## 2013-05-22 NOTE — Progress Notes (Signed)
   Subjective:    Patient ID: Jon Fields, male    DOB: 06-22-1926, 80 y.o.   MRN: [id number]  HPI Here to follow up. He has been doing reasonably well lately. He has seen Cardiology frequently and they had discussed the feasibility of placing some stents in his coronary arteries, but everyone agreed this would be too risky. Instead he has been placed on long acting nitrates and Bisoprolol. His BP has been stable and his glucoses have been stable. He was treated for a bronchitis on 04-23-13 with Augmentin but he took this for 5 days and stopped for a week, then he took the other 5 days. He improved a bit but now for the past week the cough has returned. This is non-productive but his chest feels tight. No fevers.    Review of Systems  Constitutional: Positive for fatigue. Negative for fever, chills and diaphoresis.  HENT: Positive for congestion.   Eyes: Negative.   Respiratory: Positive for cough, chest tightness, shortness of breath and wheezing.   Cardiovascular: Negative.        Objective:   Physical Exam  Constitutional:  Alert but appears weak   HENT:  Right Ear: External ear normal.  Left Ear: External ear normal.  Nose: Nose normal.  Mouth/Throat: Oropharynx is clear and moist.  Eyes: Conjunctivae are normal.  Cardiovascular: Normal rate, regular rhythm, normal heart sounds and intact distal pulses.   Pulmonary/Chest: Effort normal. No respiratory distress. He has no rales.  Scattered wheezes and rhonchi   Lymphadenopathy:    He has no cervical adenopathy.          Assessment & Plan:  Treat the residual bronchitis with a Zpack. Check an A1c today

## 2013-05-23 ENCOUNTER — Telehealth: Payer: Self-pay | Admitting: Family Medicine

## 2013-05-23 LAB — HEMOGLOBIN A1C: HEMOGLOBIN A1C: 7.8 % — AB (ref 4.6–6.5)

## 2013-05-23 NOTE — Telephone Encounter (Signed)
Relevant patient education assigned to patient using Emmi. ° °

## 2013-05-25 ENCOUNTER — Telehealth: Payer: Self-pay

## 2013-05-25 NOTE — Telephone Encounter (Signed)
Relevant patient education assigned to patient using Emmi. ° °

## 2013-05-29 ENCOUNTER — Telehealth: Payer: Self-pay | Admitting: Family Medicine

## 2013-05-29 MED ORDER — LEVOFLOXACIN 500 MG PO TABS: 500.0000 mg | ORAL_TABLET | Freq: Every day | ORAL | Status: DC

## 2013-05-29 NOTE — Telephone Encounter (Signed)
Call in Levaquin 500 mg daily for 10 days  

## 2013-05-29 NOTE — Telephone Encounter (Signed)
Pt saw dr fry last week, requesting a rx for an antibotic sent to [address], states coughing, and congestion continues to linger on

## 2013-05-29 NOTE — Telephone Encounter (Signed)
I sent script e-scribe and left a voice message for pt. 

## 2013-06-15 ENCOUNTER — Ambulatory Visit (INDEPENDENT_AMBULATORY_CARE_PROVIDER_SITE_OTHER): Payer: Medicare Other | Admitting: Cardiovascular Disease

## 2013-06-15 ENCOUNTER — Encounter: Payer: Self-pay | Admitting: Cardiovascular Disease

## 2013-06-15 VITALS — BP 124/66 | HR 68 | Ht 70.0 in | Wt 250.8 lb

## 2013-06-15 DIAGNOSIS — I10 Essential (primary) hypertension: Secondary | ICD-10-CM

## 2013-06-15 DIAGNOSIS — I2510 Atherosclerotic heart disease of native coronary artery without angina pectoris: Secondary | ICD-10-CM

## 2013-06-15 MED ORDER — FUROSEMIDE 40 MG PO TABS: 40.0000 mg | ORAL_TABLET | Freq: Every day | ORAL | Status: DC

## 2013-06-15 MED ORDER — POTASSIUM CHLORIDE ER 10 MEQ PO TBCR: 10.0000 meq | EXTENDED_RELEASE_TABLET | Freq: Every day | ORAL | Status: DC

## 2013-06-15 NOTE — Patient Instructions (Addendum)
Your physician recommends that you schedule a follow-up appointment in: [place] [facility]  Your physician recommends that you return for lab work in: [place] has recommended you make the following change in your medication:   START POTASSIUM 10 MEQ ONCE A DAY START LASIX 40 [place]

## 2013-06-15 NOTE — Progress Notes (Signed)
Jon Fields Date of Birth  [birth year] [name] [address]    [place]   [place], [place]  [address]    [place], [place]  [address] [phone]  Fax  [phone]  [phone]  Fax [phone]  Problem list: 1. History of pulmonary fibrosis 2. Diabetes Mellitus, 3. hypertension 4. Hypothyroidism 5. Chronic UTI  History of Present Illness:  Jon Fields is an 80 yo with hx of pulmonary fibrosis.  He has rare episodes of chest pains. He because of his pulmonary fibrosis he has limited exertional capacity. He's had a normal stress test a year ago. His chest pains are very atypical. He had one episode of chest pain that occurred with rest. He does not have any chest discomfort with exertion.  Nov. 13, 2014:  2 weeks ago he had an episode of CP - lasted for 10-15 minutes.  Off and on for a week.   He has chronic UTIs.  His medical doctor has requested a stress myoview.  He had a stress myoview in march 2012 - reportedly normal.    Feb. 13, 2015:  Jon Fields presented in November with some episodes of chest pain. His stress Myoview revealed: Overall Impression: High risk stress nuclear study. There is a medium size, moderate severity partially reversible defect involving the mid inferolateral myocardium. There is also a medium size, moderate severity reversible defect involving the apical myocardium. There is LV systolic dysfunction with EF 37%  Cath : LV pressure: 153/19  Aortic pressure: 149/70  Angiography  Left Main: calcified, 30 % distal stenosis  Left anterior Descending: heavily calcified. 50-60% stenosis at the origin. 80%-90% after the take off of 1st diag. Occluded after the 2nd diag. The 1st diag is a branching vessel. The superior branch is occluded, The inferior branch is small and severely diseased. The 2nd diag is a small vessel.  Left Circumflex: the LCx is calcified and is not visualized. It is occluded at its  takeoff.  Right Coronary Artery: large and dominant. Moderate - heavily calcified. Mild - moderate diffuse disease. The PDS has moderate disease. The PLSA is small and diffusely diseased and has and 80% in the proximal segment.  LV Gram: moderate LV dysfunction. EF 40%.    he was seen by Dr. Servando Fields.  He was not a candidate for CABG.    He was seeny by Dr. Martinique for consideration for PCI but together they agreed that it would not likely help much .  Was started on Imdur and Bisooprolol. He is very fatigued.  He is not able to do much activity.    His main symptom is dyspnea and fatigue   He has not taken any NTG.    He has had leg edema.     Current Outpatient Prescriptions on File Prior to Visit  Medication Sig Dispense Refill  . aspirin EC 81 MG tablet Take 81 mg by mouth every Monday, Wednesday, and Friday.      [name] azithromycin (ZITHROMAX Z-PAK) 250 MG tablet As directed  6 each  0  . bisoprolol (ZEBETA) 5 MG tablet Take 1 tablet (5 mg total) by mouth daily.  30 tablet  6  . docusate sodium (COLACE) 100 MG capsule Take 100 mg by mouth every evening.      [name] glipiZIDE (GLUCOTROL) 10 MG tablet Take 1 tablet (10 mg total) by mouth 2 (two) times daily before a meal.  180 tablet  2  . ibuprofen (ADVIL,MOTRIN) 200 MG tablet Take 200 mg by mouth 2 (two) times daily as needed (pain).       . isosorbide mononitrate (IMDUR) 30 MG 24 hr tablet Take 1 tablet (30 mg total) by mouth daily.  30 tablet  6  . levofloxacin (LEVAQUIN) 500 MG tablet Take 1 tablet (500 mg total) by mouth daily.  10 tablet  0  . lisinopril (PRINIVIL,ZESTRIL) 20 MG tablet Take 1 tablet (20 mg total) by mouth daily.  90 tablet  2  . metFORMIN (GLUCOPHAGE) 500 MG tablet Take 500 mg by mouth 2 (two) times daily with a meal.      . naproxen (NAPROSYN) 500 MG tablet Take 1 tablet (500 mg total) by mouth 2 (two) times daily with a meal.  180 tablet  3  . nitroGLYCERIN (NITROSTAT) 0.4 MG SL tablet Place 1 tablet (0.4 mg total) under  the tongue every 5 (five) minutes as needed for chest pain.  90 tablet  3  . OVER THE COUNTER MEDICATION Apply 1 application topically daily as needed (dry skin and itching). Keri Lotion      . Skin Protectants, Misc. (EUCERIN) cream Apply 1 application topically daily as needed for dry skin.       [name] triamcinolone cream (KENALOG) 0.1 % Apply 1 application topically daily as needed (Rash).       . Vitamin D, Ergocalciferol, (DRISDOL) 50000 UNITS CAPS capsule Take 50,000 Units by mouth every 7 (seven) days. Take on Saturday       No current facility-administered medications on file prior to visit.    No Known Allergies  Past Medical History  Diagnosis Date  . Sleep apnea, obstructive     sees Dr. Danton Fields, had sleep study 12-27-07  . Pulmonary fibrosis     sees Dr. Gwenette Fields   . Hypothyroidism   . Diabetes mellitus     type 2  . Depression   . Hyperlipidemia   . Hypertension   . Vitamin D deficiency   . Erectile dysfunction   . PVD (peripheral vascular disease)   . Osteoarthritis, generalized   . Arthritis   . Unsteady gait   . Vertebral compression fracture   . BPH with urinary obstruction     sees Dr. Gaynelle Fields  . Diabetes mellitus type II, uncontrolled   . Bell's palsy     2013  . CAD (coronary artery disease)     Past Surgical History  Procedure Laterality Date  . Cholecystectomy    . Appendectomy    . Transurethral resection of prostate  May 2012    per Dr. Gaynelle Fields  . Colonoscopy  06-12-02    per Dr. Lyla Fields, diverticulosis only, repeat in 10  yrs     History  Smoking status  . Never Smoker   Smokeless tobacco  . Never Used    History  Alcohol Use No    Family History  Problem Relation Age of Onset  . Pancreatic cancer Sister   . Colon cancer Neg Hx     Reviw of Systems:  Reviewed in the HPI.  All other systems are negative.  Physical Exam: Blood pressure 124/66, pulse 68, height 5\' 10"  (1.778 m), weight 250 lb 12.8 oz (113.762  kg). General: Well developed, well nourished, in no acute distress.  Head: Normocephalic, atraumatic, sclera non-icteric, mucus membranes are moist,   Neck: Supple. Carotids are 2 + without bruits. No JVD  Lungs: He has fine rales in  both bases. He has decreased breath sounds in the left base.  Heart: regular rate.  normal  S1 S2. No murmurs, gallops or rubs.  Abdomen: Soft, non-tender, non-distended with normal bowel sounds. No hepatomegaly. No rebound/guarding. No masses.  Msk:  Strength and tone are normal  Extremities: No clubbing or cyanosis. No edema.  Distal pedal pulses are 2+ and equal bilaterally.  Neuro: Alert and oriented X 3. Moves all extremities spontaneously.  Psych:  Responds to questions appropriately with a normal affect.  ECG: Nov. 13, 2014:  NSR wth 1st degree AV block, RBBB, LAFS, LVH with repol  Assessment / Plan:

## 2013-06-15 NOTE — Assessment & Plan Note (Signed)
Jon Fields has severe coronary disease and is not a candidate for coronary artery bypass grafting and is a poor candidate for PCI. We elected to proceed with medical therapy. He still has some episodes of angina although these are fairly rare.[name] His biggest complaint is that of exertional dyspnea. He has severe pulmonary fibrosis as well as moderately reduced left ventricle systolic function with an EF of 40%. His shortness breath with exertion is likely due to a combination of his pulmonary fibrosis and his chronic systolic congestive heart failure.  We discussed home oxygen. He does not want to wear home oxygen.  We'll start him on Lasix 40 mg a day as well as potassium chloride 10 mg a day. We'll check a basic metabolic profile in one month. I'll see him back in the office in 3 months for office visit and BMP.

## 2013-07-01 ENCOUNTER — Other Ambulatory Visit: Payer: Self-pay | Admitting: Family Medicine

## 2013-07-13 ENCOUNTER — Telehealth: Payer: Self-pay | Admitting: *Deleted

## 2013-07-13 ENCOUNTER — Other Ambulatory Visit (INDEPENDENT_AMBULATORY_CARE_PROVIDER_SITE_OTHER): Payer: Medicare Other

## 2013-07-13 ENCOUNTER — Encounter: Payer: Self-pay | Admitting: Pulmonary Disease

## 2013-07-13 ENCOUNTER — Other Ambulatory Visit: Payer: Medicare Other

## 2013-07-13 ENCOUNTER — Ambulatory Visit (INDEPENDENT_AMBULATORY_CARE_PROVIDER_SITE_OTHER): Payer: Medicare Other | Admitting: Pulmonary Disease

## 2013-07-13 VITALS — BP 102/60 | HR 54 | Temp 97.4°F | Ht 70.0 in | Wt 245.2 lb

## 2013-07-13 DIAGNOSIS — E875 Hyperkalemia: Secondary | ICD-10-CM

## 2013-07-13 DIAGNOSIS — I2510 Atherosclerotic heart disease of native coronary artery without angina pectoris: Secondary | ICD-10-CM

## 2013-07-13 DIAGNOSIS — J8410 Pulmonary fibrosis, unspecified: Secondary | ICD-10-CM

## 2013-07-13 DIAGNOSIS — I10 Essential (primary) hypertension: Secondary | ICD-10-CM

## 2013-07-13 LAB — BASIC METABOLIC PANEL
BUN: 33 mg/dL — ABNORMAL HIGH (ref 6–23)
CALCIUM: 9.2 mg/dL (ref 8.4–10.5)
CO2: 26 meq/L (ref 19–32)
CREATININE: 1.3 mg/dL (ref 0.4–1.5)
Chloride: 103 mEq/L (ref 96–112)
GFR: 54.41 mL/min — ABNORMAL LOW (ref >60.00)
Glucose, Bld: 283 mg/dL — ABNORMAL HIGH (ref 70–99)
Potassium: 5.2 mEq/L — ABNORMAL HIGH (ref 3.5–5.1)
Sodium: 136 mEq/L (ref 135–145)

## 2013-07-13 NOTE — Assessment & Plan Note (Signed)
The patient appears to be stable from a pulmonary standpoint, but has had a decline in his functional status related to his obesity and lack of conditioning. He decided not to participate in pulmonary rehabilitation, and currently is getting no kind of consistent activity. I have asked him to work on this. I have reviewed his chest x-ray done the end of last year, and it showed no significant change in his scarring. I will see him back in one year.

## 2013-07-13 NOTE — Telephone Encounter (Signed)
K+ 5.2, pt was just started on lasix and k+ 10 meq. Pt was asked to hold k+ and call for repeat lab draw next week// order for bmet placed. needs lab draw for 3/16 or 3/17, call back number provided with hours of phone service.

## 2013-07-13 NOTE — Patient Instructions (Signed)
You need to work on weight reduction and better conditioning as we discussed. followup with me again in one year.

## 2013-07-13 NOTE — Progress Notes (Signed)
   Subjective:    Patient ID: Jon Fields, male    DOB: 1926-09-15, 80 y.o.   MRN: [id number]  HPI Patient comes in today for followup of his interstitial lung disease, secondary to Macrodantin toxicity in the past.  Once the drug was discontinued, we have documented no progression of his interstitial disease or pulmonary function studies. The patient also has other significant medical issues as well, including coronary disease. He was referred to pulmonary rehabilitation at the last visit, but decided not to attend. He has since gained weight, and is doing very little activity, which has led to decreased endurance. He feels that his exertional tolerance is at baseline, and has very little cough at this time.   Review of Systems  Constitutional: Positive for fatigue ( endurance levels). Negative for fever and unexpected weight change.  HENT: Negative for congestion, dental problem, ear pain, nosebleeds, postnasal drip, rhinorrhea, sinus pressure, sneezing, sore throat and trouble swallowing.   Eyes: Negative for redness and itching.  Respiratory: Positive for shortness of breath. Negative for cough, chest tightness and wheezing.   Cardiovascular: Negative for palpitations and leg swelling.  Gastrointestinal: Negative for nausea and vomiting.  Genitourinary: Negative for dysuria.  Musculoskeletal: Negative for joint swelling.  Skin: Negative for rash.  Neurological: Negative for headaches.  Hematological: Does not bruise/bleed easily.  Psychiatric/Behavioral: Negative for dysphoric mood. The patient is not nervous/anxious.        Objective:   Physical Exam Obese male in no acute distress Nose without purulence or discharge noted Neck without lymphadenopathy or thyromegaly Chest with crackles in both bases, no wheezing Cardiac exam with regular rate and rhythm, 2/6 systolic murmur Lower extremities with 1+ edema, no cyanosis Alert and oriented, moves all 4 extremities.        Assessment & Plan:

## 2013-07-16 ENCOUNTER — Telehealth: Payer: Self-pay | Admitting: Cardiovascular Disease

## 2013-07-16 DIAGNOSIS — E875 Hyperkalemia: Secondary | ICD-10-CM

## 2013-07-16 NOTE — Telephone Encounter (Signed)
New message         Pt father has stopped his potassium medication since Saturday 3/14. Pt daughter would like to know when to bring him in for labs.

## 2013-07-16 NOTE — Telephone Encounter (Signed)
Lab tomorrow for hyperkalemia, bmet ordered.

## 2013-07-16 NOTE — Telephone Encounter (Signed)
Has lab date for 3/17.

## 2013-07-17 ENCOUNTER — Other Ambulatory Visit (INDEPENDENT_AMBULATORY_CARE_PROVIDER_SITE_OTHER): Payer: Medicare Other

## 2013-07-17 ENCOUNTER — Telehealth: Payer: Self-pay | Admitting: Family Medicine

## 2013-07-17 DIAGNOSIS — E875 Hyperkalemia: Secondary | ICD-10-CM

## 2013-07-17 NOTE — Telephone Encounter (Signed)
[id number] - [place], [place] [address] AT [employer] requesting re-fill on Lancets (ACCU-CHEK MULTICLIX) lancets [[id number]]

## 2013-07-18 LAB — BASIC METABOLIC PANEL
BUN: 32 mg/dL — ABNORMAL HIGH (ref 6–23)
CO2: 28 mEq/L (ref 19–32)
Calcium: 9.5 mg/dL (ref 8.4–10.5)
Chloride: 103 mEq/L (ref 96–112)
Creatinine, Ser: 1.3 mg/dL (ref 0.4–1.5)
GFR: 54.89 mL/min — AB (ref >60.00)
Glucose, Bld: 197 mg/dL — ABNORMAL HIGH (ref 70–99)
POTASSIUM: 4.4 meq/L (ref 3.5–5.1)
SODIUM: 138 meq/L (ref 135–145)

## 2013-07-18 MED ORDER — ACCU-CHEK MULTICLIX LANCETS MISC: Status: AC

## 2013-07-18 NOTE — Telephone Encounter (Signed)
I sent script e-scribe. 

## 2013-07-18 NOTE — Telephone Encounter (Signed)
msg left for pt to call office for further instruction// Pt to hold k+, recheck bmet in 2 weeks, ov 5/18.

## 2013-07-18 NOTE — Telephone Encounter (Signed)
Pharmacy calling back to check on status of this refill

## 2013-07-19 ENCOUNTER — Telehealth: Payer: Self-pay | Admitting: Cardiovascular Disease

## 2013-07-19 NOTE — Telephone Encounter (Signed)
New Prob    Calling regarding pts most recent Potassium level. Please call.

## 2013-07-19 NOTE — Telephone Encounter (Signed)
Reviewed labs/ was told he is to hold k+ till next lab draw in 2 weeks, date given and told her that he may need to go back on K+ but hold it for now, she verbalized understanding.

## 2013-07-19 NOTE — Telephone Encounter (Signed)
Lab date given / k+ was dc for now till labs rechecked in 2 weeks.

## 2013-07-19 NOTE — Telephone Encounter (Signed)
Follow Up ° ° ° °Pt is following up on test results. Please call. °

## 2013-07-19 NOTE — Telephone Encounter (Signed)
Jonathon Jordan, RN at 07/18/2013 12:01 PM     Status: Signed        msg left for pt to call office for further instruction// Pt to hold k+, recheck bmet in 2 weeks, ov 5/18.

## 2013-08-02 ENCOUNTER — Other Ambulatory Visit (INDEPENDENT_AMBULATORY_CARE_PROVIDER_SITE_OTHER): Payer: Medicare Other

## 2013-08-02 DIAGNOSIS — E875 Hyperkalemia: Secondary | ICD-10-CM

## 2013-08-02 LAB — BASIC METABOLIC PANEL
BUN: 44 mg/dL — ABNORMAL HIGH (ref 6–23)
CALCIUM: 9.7 mg/dL (ref 8.4–10.5)
CO2: 26 meq/L (ref 19–32)
Chloride: 98 mEq/L (ref 96–112)
Creatinine, Ser: 1.5 mg/dL (ref 0.4–1.5)
GFR: 46.64 mL/min — ABNORMAL LOW (ref >60.00)
Glucose, Bld: 227 mg/dL — ABNORMAL HIGH (ref 70–99)
Potassium: 4.5 mEq/L (ref 3.5–5.1)
SODIUM: 137 meq/L (ref 135–145)

## 2013-08-03 ENCOUNTER — Telehealth: Payer: Self-pay | Admitting: Cardiovascular Disease

## 2013-08-03 NOTE — Telephone Encounter (Signed)
Lab note reviewed.

## 2013-08-03 NOTE — Telephone Encounter (Signed)
New message ° ° °Patient is calling for test results.  °

## 2013-08-09 ENCOUNTER — Other Ambulatory Visit: Payer: Self-pay

## 2013-08-11 ENCOUNTER — Other Ambulatory Visit: Payer: Self-pay | Admitting: Family Medicine

## 2013-08-21 ENCOUNTER — Ambulatory Visit (INDEPENDENT_AMBULATORY_CARE_PROVIDER_SITE_OTHER): Payer: Medicare Other | Admitting: Family Medicine

## 2013-08-21 ENCOUNTER — Encounter: Payer: Self-pay | Admitting: Family Medicine

## 2013-08-21 VITALS — BP 136/58 | HR 64 | Temp 98.1°F | Ht 70.0 in | Wt 238.0 lb

## 2013-08-21 DIAGNOSIS — G4733 Obstructive sleep apnea (adult) (pediatric): Secondary | ICD-10-CM

## 2013-08-21 DIAGNOSIS — I2510 Atherosclerotic heart disease of native coronary artery without angina pectoris: Secondary | ICD-10-CM

## 2013-08-21 DIAGNOSIS — M129 Arthropathy, unspecified: Secondary | ICD-10-CM

## 2013-08-21 DIAGNOSIS — I10 Essential (primary) hypertension: Secondary | ICD-10-CM

## 2013-08-21 DIAGNOSIS — E785 Hyperlipidemia, unspecified: Secondary | ICD-10-CM

## 2013-08-21 DIAGNOSIS — F329 Major depressive disorder, single episode, unspecified: Secondary | ICD-10-CM

## 2013-08-21 DIAGNOSIS — F3289 Other specified depressive episodes: Secondary | ICD-10-CM

## 2013-08-21 DIAGNOSIS — E039 Hypothyroidism, unspecified: Secondary | ICD-10-CM

## 2013-08-21 DIAGNOSIS — J8410 Pulmonary fibrosis, unspecified: Secondary | ICD-10-CM

## 2013-08-21 DIAGNOSIS — N390 Urinary tract infection, site not specified: Secondary | ICD-10-CM

## 2013-08-21 DIAGNOSIS — E119 Type 2 diabetes mellitus without complications: Secondary | ICD-10-CM

## 2013-08-21 LAB — POCT URINALYSIS DIPSTICK
Blood, UA: NEGATIVE
GLUCOSE UA: NEGATIVE
Nitrite, UA: NEGATIVE
PH UA: 5
SPEC GRAV UA: 1.025
Urobilinogen, UA: 0.2

## 2013-08-21 LAB — HEMOGLOBIN A1C: Hgb A1c MFr Bld: 7.9 % — ABNORMAL HIGH (ref 4.6–6.5)

## 2013-08-21 MED ORDER — METFORMIN HCL 500 MG PO TABS: 500.0000 mg | ORAL_TABLET | Freq: Two times a day (BID) | ORAL | Status: AC

## 2013-08-21 MED ORDER — GLIPIZIDE 10 MG PO TABS: ORAL_TABLET | ORAL | Status: AC

## 2013-08-21 MED ORDER — BISOPROLOL FUMARATE 5 MG PO TABS: 5.0000 mg | ORAL_TABLET | Freq: Every day | ORAL | Status: AC

## 2013-08-21 MED ORDER — NAPROXEN 500 MG PO TABS: 500.0000 mg | ORAL_TABLET | Freq: Two times a day (BID) | ORAL | Status: DC

## 2013-08-21 MED ORDER — GLIPIZIDE 10 MG PO TABS: 10.0000 mg | ORAL_TABLET | Freq: Two times a day (BID) | ORAL | Status: DC

## 2013-08-21 MED ORDER — LISINOPRIL 20 MG PO TABS
ORAL_TABLET | ORAL | Status: DC
Start: 2013-08-21 — End: 2014-04-01

## 2013-08-21 MED ORDER — FUROSEMIDE 40 MG PO TABS: 40.0000 mg | ORAL_TABLET | Freq: Every day | ORAL | Status: DC

## 2013-08-21 NOTE — Addendum Note (Signed)
Addended by: Alysia Penna A on: 08/21/2013 04:02 PM   Modules accepted: Orders

## 2013-08-21 NOTE — Progress Notes (Signed)
Pre visit review using our clinic review tool, if applicable. No additional management support is needed unless otherwise documented below in the visit note. 

## 2013-08-21 NOTE — Progress Notes (Signed)
   Subjective:    Patient ID: Jon Fields, male    DOB: [birth year], 80 y.o.   MRN: [id number]  HPI Here to follow up. His glucoses are stable ranging form the 110s to the 140s. He still complains of constant fatigue but no chest pain and not much SOB.    Review of Systems  Constitutional: Positive for fatigue. Negative for activity change, appetite change and unexpected weight change.  Respiratory: Negative.   Cardiovascular: Negative.        Objective:   Physical Exam  Constitutional: He is oriented to person, place, and time. He appears well-developed and well-nourished.  Cardiovascular: Normal rate, regular rhythm, normal heart sounds and intact distal pulses.   Pulmonary/Chest: Effort normal and breath sounds normal.  Neurological: He is alert and oriented to person, place, and time.          Assessment & Plan:  He seems to be doing as well as could be expected. Encouraged him to get low level exercise, like walking around his facility for 30 minutes daily.

## 2013-08-23 ENCOUNTER — Telehealth: Payer: Self-pay | Admitting: Family Medicine

## 2013-08-23 LAB — URINE CULTURE
COLONY COUNT: NO GROWTH
ORGANISM ID, BACTERIA: NO GROWTH

## 2013-08-23 NOTE — Telephone Encounter (Signed)
Pt called you back and would like a returned call

## 2013-08-27 NOTE — Telephone Encounter (Signed)
I spoke with pt  

## 2013-08-31 ENCOUNTER — Telehealth: Payer: Self-pay

## 2013-08-31 NOTE — Telephone Encounter (Signed)
Relevant patient education assigned to patient using Emmi. ° °

## 2013-09-14 ENCOUNTER — Other Ambulatory Visit: Payer: Self-pay

## 2013-09-14 MED ORDER — ISOSORBIDE MONONITRATE ER 30 MG PO TB24: 30.0000 mg | ORAL_TABLET | Freq: Every day | ORAL | Status: DC

## 2013-09-14 NOTE — Telephone Encounter (Signed)
Pt called and states he was given hard copies of his refills but he is missing the isosorbide mononitrate (IMDUR) 30 MG 24 hr tablet.  Pt would like to pick up a hard copy on Monday.

## 2013-09-17 ENCOUNTER — Encounter: Payer: Self-pay | Admitting: Cardiovascular Disease

## 2013-09-17 ENCOUNTER — Other Ambulatory Visit: Payer: Medicare Other

## 2013-09-17 ENCOUNTER — Ambulatory Visit (INDEPENDENT_AMBULATORY_CARE_PROVIDER_SITE_OTHER): Payer: Medicare Other | Admitting: Cardiovascular Disease

## 2013-09-17 VITALS — BP 116/70 | HR 64 | Ht 70.0 in | Wt 245.8 lb

## 2013-09-17 DIAGNOSIS — I10 Essential (primary) hypertension: Secondary | ICD-10-CM

## 2013-09-17 DIAGNOSIS — I2510 Atherosclerotic heart disease of native coronary artery without angina pectoris: Secondary | ICD-10-CM

## 2013-09-17 LAB — BASIC METABOLIC PANEL
BUN: 37 mg/dL — ABNORMAL HIGH (ref 6–23)
CHLORIDE: 103 meq/L (ref 96–112)
CO2: 26 mEq/L (ref 19–32)
Calcium: 10.2 mg/dL (ref 8.4–10.5)
Creat: 1.48 mg/dL — ABNORMAL HIGH (ref 0.50–1.35)
Glucose, Bld: 139 mg/dL — ABNORMAL HIGH (ref 70–99)
Potassium: 4.5 mEq/L (ref 3.5–5.3)
Sodium: 139 mEq/L (ref 135–145)

## 2013-09-17 NOTE — Progress Notes (Signed)
Jon Fields Date of Birth  06/21/1926 [place] [address]    [place]   [place], [place]  [address]    [place], [place]  [phone]  Fax  [phone]  [phone]  Fax [phone]  Problem list: 1. History of pulmonary fibrosis 2. Diabetes Mellitus, 3. hypertension 4. Hypothyroidism 5. Chronic UTI  History of Present Illness:  Jon Fields is an 80 yo with hx of pulmonary fibrosis.  He has rare episodes of chest pains. He because of his pulmonary fibrosis he has limited exertional capacity. He's had a normal stress test a year ago. His chest pains are very atypical. He had one episode of chest pain that occurred with rest. He does not have any chest discomfort with exertion.  Nov. 13, 2014:  2 weeks ago he had an episode of CP - lasted for 10-15 minutes.  Off and on for a week.   He has chronic UTIs.  His medical doctor has requested a stress myoview.  He had a stress myoview in march 2012 - reportedly normal.    Feb. 13, 2015:  Jon Fields presented in November with some episodes of chest pain. His stress Myoview revealed: Overall Impression: High risk stress nuclear study. There is a medium size, moderate severity partially reversible defect involving the mid inferolateral myocardium. There is also a medium size, moderate severity reversible defect involving the apical myocardium. There is LV systolic dysfunction with EF 37%  Cath : LV pressure: 153/19  Aortic pressure: 149/70  Angiography  Left Main: calcified, 30 % distal stenosis  Left anterior Descending: heavily calcified. 50-60% stenosis at the origin. 80%-90% after the take off of 1st diag. Occluded after the 2nd diag. The 1st diag is a branching vessel. The superior branch is occluded, The inferior branch is small and severely diseased. The 2nd diag is a small vessel.  Left Circumflex: the LCx is calcified and is not visualized. It is occluded at its  takeoff.  Right Coronary Artery: large and dominant. Moderate - heavily calcified. Mild - moderate diffuse disease. The PDS has moderate disease. The PLSA is small and diffusely diseased and has and 80% in the proximal segment.  LV Gram: moderate LV dysfunction. EF 40%.    he was seen by Dr. Servando Snare.  He was not a candidate for CABG.    He was seeny by Dr. Martinique for consideration for PCI but together they agreed that it would not likely help much .  Was started on Imdur and Bisooprolol. He is very fatigued.  He is not able to do much activity.    His main symptom is dyspnea and fatigue   He has not taken any NTG.    He has had leg edema.    Sep 17, 2013:  Jon Fields thinks that his episodes of angina may be gradually worsened. His daughter also notes that he is more active now than it summer time. He is getting out going to the pool more often and so these  increased episodes of angina may in fact just be due to the fact that he is more active.     Current Outpatient Prescriptions on File Prior to Visit  Medication Sig Dispense Refill  . aspirin EC 81 MG tablet Take 81 mg by mouth every Monday, Wednesday, and Friday.      . bisoprolol (ZEBETA) 5 MG tablet Take 1 tablet (5 mg total) by mouth daily.  90 tablet  3  . docusate sodium (COLACE) 100 MG capsule Take 100 mg by mouth every evening.      . furosemide (LASIX) 40 MG tablet Take 1 tablet (40 mg total) by mouth daily.  90 tablet  3  . glipiZIDE (GLUCOTROL) 10 MG tablet Take 1 tablet (10 mg total) by mouth 2 (two) times daily before a meal.  180 tablet  3  . glipiZIDE (GLUCOTROL) 10 MG tablet TAKE 1 TABLET BY MOUTH TWICE DAILY BEFORE MEALS  180 tablet  3  . ibuprofen (ADVIL,MOTRIN) 200 MG tablet Take 200 mg by mouth 2 (two) times daily as needed (pain).       . isosorbide mononitrate (IMDUR) 30 MG 24 hr tablet Take 1 tablet (30 mg total) by mouth daily.  90 tablet  3  . Lancets (ACCU-CHEK MULTICLIX) lancets Use as instructed  100 each  3   . lisinopril (PRINIVIL,ZESTRIL) 20 MG tablet Take 1 tablet by mouth  daily  90 tablet  3  . metFORMIN (GLUCOPHAGE) 500 MG tablet Take 1 tablet (500 mg total) by mouth 2 (two) times daily with a meal.  180 tablet  3  . naproxen (NAPROSYN) 500 MG tablet Take 1 tablet (500 mg total) by mouth 2 (two) times daily with a meal.  180 tablet  3  . nitroGLYCERIN (NITROSTAT) 0.4 MG SL tablet Place 1 tablet (0.4 mg total) under the tongue every 5 (five) minutes as needed for chest pain.  90 tablet  3  . OVER THE COUNTER MEDICATION Apply 1 application topically daily as needed (dry skin and itching). Keri Lotion      . Skin Protectants, Misc. (EUCERIN) cream Apply 1 application topically daily as needed for dry skin.       [name] triamcinolone cream (KENALOG) 0.1 % Apply 1 application topically daily as needed (Rash).       . Vitamin D, Ergocalciferol, (DRISDOL) 50000 UNITS CAPS capsule Take 50,000 Units by mouth every 7 (seven) days. Take on Saturday      . Vitamin Mixture (VITAMIN E COMPLETE) CAPS Take 1 capsule by mouth daily.       No current facility-administered medications on file prior to visit.    No Known Allergies  Past Medical History  Diagnosis Date  . Sleep apnea, obstructive     sees Dr. Danton Sewer, had sleep study 12-27-07  . Pulmonary fibrosis     sees Dr. Gwenette Greet   . Hypothyroidism   . Diabetes mellitus     type 2  . Depression   . Hyperlipidemia   . Hypertension   . Vitamin D deficiency   . Erectile dysfunction   . PVD (peripheral vascular disease)   . Osteoarthritis, generalized   . Arthritis   . Unsteady gait   . Vertebral compression fracture   . BPH with urinary obstruction     sees Dr. Gaynelle Arabian  . Diabetes mellitus type II, uncontrolled   . Bell's palsy     2013  . CAD (coronary artery disease)   . Cancer     prostate    Past Surgical History  Procedure Laterality Date  . Cholecystectomy    . Appendectomy    . Transurethral resection of prostate  May 2012     per Dr. Gaynelle Arabian  . Colonoscopy  06-12-02    per Dr. Lyla Son, diverticulosis only, repeat in 10  yrs     History  Smoking status  . Never Smoker   Smokeless  tobacco  . Never Used    History  Alcohol Use No    Family History  Problem Relation Age of Onset  . Pancreatic cancer Sister   . Colon cancer Neg Hx     Reviw of Systems:  Reviewed in the HPI.  All other systems are negative.  Physical Exam: Blood pressure 116/70, pulse 64, height 5\' 10"  (1.778 m), weight 245 lb 12.8 oz (111.494 kg). General: Well developed, well nourished, in no acute distress.  Head: Normocephalic, atraumatic, sclera non-icteric, mucus membranes are moist,   Neck: Supple. Carotids are 2 + without bruits. No JVD  Lungs: He has fine rales in both bases. He has decreased breath sounds in the left base.  Heart: regular rate.  normal  S1 S2. No murmurs, gallops or rubs.  Abdomen: Soft, non-tender, non-distended with normal bowel sounds. No hepatomegaly. No rebound/guarding. No masses.  Msk:  Strength and tone are normal  Extremities: No clubbing or cyanosis. No edema.  Distal pedal pulses are 2+ and equal bilaterally.  Neuro: Alert and oriented X 3. Moves all extremities spontaneously.  Psych:  Responds to questions appropriately with a normal affect.  ECG: Nov. 13, 2014:  NSR wth 1st degree AV block, RBBB, LAFS, LVH with repol  Assessment / Plan:

## 2013-09-17 NOTE — Assessment & Plan Note (Signed)
Jon Fields has severe CAD - not amenable to PCI or CABG.  He takes NTG on an as needed basis - perhaps 1-2 per week.   We discussed adding Imdur and also adding Ranexa.  At this point, I think that we can continue just as we are doing.  He has severe pulmonary fibrosis and is quite limited from that.  I will see him in 6 months for OV.

## 2013-09-17 NOTE — Patient Instructions (Signed)
Your physician recommends that you have lab work: TODAY  Your physician wants you to follow-up in: 6 months with Dr. Acie Fredrickson.  You will receive a reminder letter in the mail two months in advance. If you don't receive a letter, please call our office to schedule the follow-up appointment.

## 2013-09-21 ENCOUNTER — Telehealth: Payer: Self-pay | Admitting: Family Medicine

## 2013-09-21 NOTE — Telephone Encounter (Signed)
Pt stated that Glipizide was listed on his medication list twice and wants to remove the duplicate and I did remove this from list.

## 2013-10-14 ENCOUNTER — Other Ambulatory Visit: Payer: Self-pay | Admitting: Family Medicine

## 2013-11-10 ENCOUNTER — Other Ambulatory Visit: Payer: Self-pay | Admitting: Cardiology

## 2013-11-20 ENCOUNTER — Encounter: Payer: Self-pay | Admitting: Family Medicine

## 2013-11-20 ENCOUNTER — Ambulatory Visit (INDEPENDENT_AMBULATORY_CARE_PROVIDER_SITE_OTHER): Payer: Medicare Other | Admitting: Family Medicine

## 2013-11-20 VITALS — BP 116/58 | HR 74 | Temp 97.8°F | Ht 70.0 in | Wt 245.0 lb

## 2013-11-20 DIAGNOSIS — R269 Unspecified abnormalities of gait and mobility: Secondary | ICD-10-CM

## 2013-11-20 DIAGNOSIS — M1990 Unspecified osteoarthritis, unspecified site: Secondary | ICD-10-CM

## 2013-11-20 DIAGNOSIS — F329 Major depressive disorder, single episode, unspecified: Secondary | ICD-10-CM

## 2013-11-20 DIAGNOSIS — E1165 Type 2 diabetes mellitus with hyperglycemia: Secondary | ICD-10-CM

## 2013-11-20 DIAGNOSIS — IMO0001 Reserved for inherently not codable concepts without codable children: Secondary | ICD-10-CM

## 2013-11-20 DIAGNOSIS — E039 Hypothyroidism, unspecified: Secondary | ICD-10-CM

## 2013-11-20 DIAGNOSIS — IMO0002 Reserved for concepts with insufficient information to code with codable children: Secondary | ICD-10-CM

## 2013-11-20 DIAGNOSIS — I10 Essential (primary) hypertension: Secondary | ICD-10-CM

## 2013-11-20 DIAGNOSIS — J8410 Pulmonary fibrosis, unspecified: Secondary | ICD-10-CM

## 2013-11-20 DIAGNOSIS — F3289 Other specified depressive episodes: Secondary | ICD-10-CM

## 2013-11-20 LAB — BASIC METABOLIC PANEL
BUN: 39 mg/dL — ABNORMAL HIGH (ref 6–23)
CO2: 27 mEq/L (ref 19–32)
Calcium: 9.8 mg/dL (ref 8.4–10.5)
Chloride: 99 mEq/L (ref 96–112)
Creatinine, Ser: 1.7 mg/dL — ABNORMAL HIGH (ref 0.4–1.5)
GFR: 40.68 mL/min — ABNORMAL LOW (ref >60.00)
Glucose, Bld: 297 mg/dL — ABNORMAL HIGH (ref 70–99)
POTASSIUM: 4.9 meq/L (ref 3.5–5.1)
Sodium: 135 mEq/L (ref 135–145)

## 2013-11-20 LAB — CBC WITH DIFFERENTIAL/PLATELET
Basophils Absolute: 0 10*3/uL (ref 0.0–0.1)
Basophils Relative: 0.4 % (ref 0.0–3.0)
EOS ABS: 0.4 10*3/uL (ref 0.0–0.7)
Eosinophils Relative: 4.4 % (ref 0.0–5.0)
HCT: 37 % — ABNORMAL LOW (ref 39.0–52.0)
Hemoglobin: 12.4 g/dL — ABNORMAL LOW (ref 13.0–17.0)
Lymphocytes Relative: 22.6 % (ref 12.0–46.0)
Lymphs Abs: 1.9 10*3/uL (ref 0.7–4.0)
MCHC: 33.4 g/dL (ref 30.0–36.0)
MCV: 94.5 fl (ref 78.0–100.0)
Monocytes Absolute: 1 10*3/uL (ref 0.1–1.0)
Monocytes Relative: 12.2 % — ABNORMAL HIGH (ref 3.0–12.0)
Neutro Abs: 5.1 10*3/uL (ref 1.4–7.7)
Neutrophils Relative %: 60.4 % (ref 43.0–77.0)
PLATELETS: 202 10*3/uL (ref 150.0–400.0)
RBC: 3.91 Mil/uL — ABNORMAL LOW (ref 4.22–5.81)
RDW: 13.5 % (ref 11.5–15.5)
WBC: 8.4 10*3/uL (ref 4.0–10.5)

## 2013-11-20 LAB — TSH: TSH: 1 u[IU]/mL (ref 0.35–4.50)

## 2013-11-20 LAB — HEMOGLOBIN A1C: Hgb A1c MFr Bld: 7.7 % — ABNORMAL HIGH (ref 4.6–6.5)

## 2013-11-20 MED ORDER — BUPROPION HCL ER (XL) 150 MG PO TB24: 150.0000 mg | ORAL_TABLET | Freq: Every day | ORAL | Status: DC

## 2013-11-20 NOTE — Progress Notes (Signed)
   Subjective:    Patient ID: Jon Fields, male    DOB: 09/19/1926, 80 y.o.   MRN: [id number]  HPI Here with his daughter to follow up. Since we last saw him he had a cardiac stress test showing diffuse severe CAD which indicates he is not a candidate for bypass or stenting. He was started on Imdur but he can't tell if it helps or not. He still feels weak and gets SOB on minimal exertion. 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 He mostly complains today of his depression. He stopped taking Zoloft last year because it did not help, and we have talked to him about trying something else. He has been resistant to this but now his daughter seems to have convinced him to try this. He feels lonely and sad, but he admits he enjoys being around other people. He sleeps well.    Review of Systems  Constitutional: Positive for fatigue.  Respiratory: Positive for shortness of breath. Negative for cough and wheezing.   Cardiovascular: Positive for chest pain. Negative for palpitations and leg swelling.  Psychiatric/Behavioral: Positive for dysphoric mood. Negative for suicidal ideas, hallucinations, confusion, sleep disturbance, decreased concentration and agitation. The patient is not nervous/anxious.        Objective:   Physical Exam  Constitutional: He is oriented to person, place, and time. He appears well-developed and well-nourished.  Cardiovascular: Normal rate, regular rhythm, normal heart sounds and intact distal pulses.   Pulmonary/Chest: Effort normal and breath sounds normal.  Neurological: He is alert and oriented to person, place, and time.  Psychiatric: He has a normal mood and affect. His behavior is normal. Judgment and thought content normal.          Assessment & Plan:  Try Wellbutrin XL 150 mg daily. Let me know in a month how he is doing. We wrote for him to get a motorized  wheelchair. Currently he uses his walker most of the time. Get labs

## 2013-11-20 NOTE — Progress Notes (Signed)
Pre visit review using our clinic review tool, if applicable. No additional management support is needed unless otherwise documented below in the visit note. 

## 2013-11-21 ENCOUNTER — Telehealth: Payer: Self-pay | Admitting: Family Medicine

## 2013-11-21 NOTE — Telephone Encounter (Signed)
Relevant patient education assigned to patient using Emmi. ° °

## 2013-11-28 ENCOUNTER — Telehealth: Payer: Self-pay | Admitting: Family Medicine

## 2013-11-28 DIAGNOSIS — H53133 Sudden visual loss, bilateral: Secondary | ICD-10-CM

## 2013-11-28 NOTE — Telephone Encounter (Signed)
We received a phone call and a fax from Dr. Sharyne Peach who found this patient to have bilateral visual field losses in the left lower quadrant. The patient had complained of sudden partial visual loss for the prior week. They are concerned that he had a recent stroke. We will refer him urgently to Neurology to evaluate.

## 2013-11-30 ENCOUNTER — Encounter: Payer: Self-pay | Admitting: Neurology

## 2013-11-30 ENCOUNTER — Ambulatory Visit (INDEPENDENT_AMBULATORY_CARE_PROVIDER_SITE_OTHER): Payer: Medicare Other | Admitting: Neurology

## 2013-11-30 VITALS — BP 118/60 | HR 64 | Ht 70.08 in | Wt 243.2 lb

## 2013-11-30 DIAGNOSIS — I6350 Cerebral infarction due to unspecified occlusion or stenosis of unspecified cerebral artery: Secondary | ICD-10-CM

## 2013-11-30 DIAGNOSIS — H53133 Sudden visual loss, bilateral: Secondary | ICD-10-CM

## 2013-11-30 DIAGNOSIS — I639 Cerebral infarction, unspecified: Secondary | ICD-10-CM

## 2013-11-30 DIAGNOSIS — H53139 Sudden visual loss, unspecified eye: Secondary | ICD-10-CM

## 2013-11-30 MED ORDER — ATORVASTATIN CALCIUM 40 MG PO TABS: 40.0000 mg | ORAL_TABLET | Freq: Every day | ORAL | Status: DC

## 2013-11-30 NOTE — Progress Notes (Signed)
[facility] Clinic Note - Initial Visit   Date: 11/30/2013  Jon Fields MRN: [id number] DOB: [birth year]   Dear Dr. Sarajane Jews:  Thank you for your kind referral of Jon Fields for consultation of sudden vision loss. Although his history is well known to you, please allow [place] to reiterate it for the purpose of our medical record. The patient was accompanied to the clinic by daughter who also provides collateral information.     History of Present Illness: Jon Fields is a 80 y.o. right-handed Caucasian male with history of diabetes mellitus type 2 (HbA1c 7.7), hypertension, hyperlipidemia, CAD, post-inflammatory pulmonary fibrosis, OSA, and hypothytoidism,  presenting for evaluation of sudden vision loss.    Starting in mid-July, he was walking down a hallway at his [facility] center and "it looked different", because he didn't see everything.  Symptoms started suddenly and have not changed since them. He was in a power wheelchair last week in [place] and ran into the end of a grocery isle as he was making a right turn.  He stopped driving about two weeks ago. His vision deficits include seeing things to his lower leg vision field. He is drooling a little more than before, but doesn't recall when that started.  He went and had his eyes checked on 7/29 byDr. Sharyne Peach, who found that he has bilateral visual field losses in the left lower quadrant so referred for evaluation.  He denies any numbness/tinging, weakness, facial weakness, or trouble swallowing.   He has history of Bell's palsy with right facial weakness in 2013 and recovered well from it.   He takes aspirin 81mg  three times per week because of easy bruising.  He has no prior history of stroke or TIA.  He also complains of balance problems and stumbling frequently over the past few months. He uses the furniture for support.  He had a fall about 1.5 years ago.  He has been using a cane for 1-2  years intermittently and started using a rollator about 36-months ago.  He has dyspnea on exertion so asked his PCP for a powerchair.   Out-side paper records, electronic medical record, and images have been reviewed where available and summarized as:  Lab Results  Component Value Date   HGBA1C 7.7* 11/20/2013   Lab Results  Component Value Date   TSH 1.00 11/20/2013   Lab Results  Component Value Date   VITAMINB12 319 02/14/2012   Lab Results  Component Value Date   CHOL 157 06/29/2012   HDL 33.00* 06/29/2012   LDLCALC 106* 06/29/2012   TRIG 91.0 06/29/2012   CHOLHDL 5 06/29/2012    MRI lumbar spine 02/02/2013: 1. Moderate multifactorial spinal stenosis at L4-5. There is mild lateral recess and foraminal stenosis bilaterally, worse on the left due to a contributory synovial cyst projecting medially from the facet joint.  2. Mild central stenosis at L2-3 and borderline central stenosis at L3-4. No high-grade foraminal stenosis or definite right-sided nerve root encroachment.  3. Diffuse idiopathic skeletal hyperostosis.   MRI brain wo contrast 04/03/2012: 1. No acute or focal abnormality of the brain to explain the patient's symptoms.  2. Normal appearance of the brain for age.  3. Polyp or mucous retention cyst in the left maxillary sinus.  4. Minimal fluid in the left mastoid air cells is likely chronic.  MRI thoracic spine 08/28/2007: 1. Linear fracture of T6 extending to the anterior aspect of the vertebral body near the inferior endplate.  2. Focal 7 x 5 mm extra-axial, intradural mass lesion in the posterior aspect of the canal at the T6-7 level. This creates significant cord compression with abnormal signal above the level. Given the acute fracture, this is most concerning for focal hemorrhage. Neoplasm or focal CSF collection (arachnoid cyst) could have a similar appearance but are considered less likely given the acute fracture.    Past Medical History  Diagnosis Date  .  Sleep apnea, obstructive     sees Dr. Danton Sewer, had sleep study 12-27-07  . Pulmonary fibrosis     sees Dr. Gwenette Greet   . Hypothyroidism   . Diabetes mellitus     type 2  . Depression   . Hyperlipidemia   . Hypertension   . Vitamin D deficiency   . Erectile dysfunction   . PVD (peripheral vascular disease)   . Osteoarthritis, generalized   . Arthritis   . Unsteady gait   . Vertebral compression fracture   . BPH with urinary obstruction     sees Dr. Gaynelle Arabian  . Diabetes mellitus type II, uncontrolled   . Bell's palsy     2013  . CAD (coronary artery disease)   . Cancer     prostate    Past Surgical History  Procedure Laterality Date  . Cholecystectomy    . Appendectomy    . Transurethral resection of prostate  May 2012    per Dr. Gaynelle Arabian  . Colonoscopy  06-12-02    per Dr. Lyla Son, diverticulosis only, repeat in 10  yrs      Medications:  Current Outpatient Prescriptions on File Prior to Visit  Medication Sig Dispense Refill  . ACCU-CHEK AVIVA PLUS test strip TEST ONCE PER DAY (DIAGNOSIS IS 250.00)  100 each  3  . aspirin EC 81 MG tablet Take 81 mg by mouth every Monday, Wednesday, and Friday.      . bisoprolol (ZEBETA) 5 MG tablet Take 1 tablet (5 mg total) by mouth daily.  90 tablet  3  . buPROPion (WELLBUTRIN XL) 150 MG 24 hr tablet Take 1 tablet (150 mg total) by mouth daily.  30 tablet  5  . docusate sodium (COLACE) 100 MG capsule Take 100 mg by mouth every evening.      . furosemide (LASIX) 40 MG tablet Take 1 tablet (40 mg total) by mouth daily.  90 tablet  3  . glipiZIDE (GLUCOTROL) 10 MG tablet TAKE 1 TABLET BY MOUTH TWICE DAILY BEFORE MEALS  180 tablet  3  . ibuprofen (ADVIL,MOTRIN) 200 MG tablet Take 200 mg by mouth 2 (two) times daily as needed (pain).       . isosorbide mononitrate (IMDUR) 30 MG 24 hr tablet TAKE 1 TABLET (30 MG TOTAL) BY MOUTH DAILY.  30 tablet  6  . Lancets (ACCU-CHEK MULTICLIX) lancets Use as instructed  100 each  3  .  lisinopril (PRINIVIL,ZESTRIL) 20 MG tablet Take 1 tablet by mouth  daily  90 tablet  3  . metFORMIN (GLUCOPHAGE) 500 MG tablet Take 1 tablet (500 mg total) by mouth 2 (two) times daily with a meal.  180 tablet  3  . naproxen (NAPROSYN) 500 MG tablet Take 1 tablet (500 mg total) by mouth 2 (two) times daily with a meal.  180 tablet  3  . nitroGLYCERIN (NITROSTAT) 0.4 MG SL tablet Place 1 tablet (0.4 mg total) under the tongue every 5 (five) minutes as needed for chest pain.  90 tablet  3  .  OVER THE COUNTER MEDICATION Apply 1 application topically daily as needed (dry skin and itching). Keri Lotion      . Skin Protectants, Misc. (EUCERIN) cream Apply 1 application topically daily as needed for dry skin.       [name] triamcinolone cream (KENALOG) 0.1 % Apply 1 application topically daily as needed (Rash).       . Vitamin D, Ergocalciferol, (DRISDOL) 50000 UNITS CAPS capsule Take 50,000 Units by mouth every 7 (seven) days. Take on Saturday       No current facility-administered medications on file prior to visit.    Allergies: No Known Allergies  Family History: Family History  Problem Relation Age of Onset  . Pancreatic cancer Sister   . Colon cancer Neg Hx     Social History: History   Social History  . Marital Status: Widowed    Spouse Name: N/A    Number of Children: N/A  . Years of Education: N/A   Occupational History  . Not on file.   Social History Main Topics  . Smoking status: Never Smoker   . Smokeless tobacco: Never Used  . Alcohol Use: No  . Drug Use: No  . Sexual Activity: Not on file   Other Topics Concern  . Not on file   Social History Narrative  . No narrative on file    Review of Systems:  CONSTITUTIONAL: No fevers, chills, night sweats, or weight loss.   EYES: + visual changes or eye pain ENT: No hearing changes.  No history of nose bleeds.   RESPIRATORY: No cough, wheezing and shortness of breath.   CARDIOVASCULAR: Negative for chest pain, and  palpitations.   GI: Negative for abdominal discomfort, blood in stools or black stools.  No recent change in bowel habits.   GU:  No history of incontinence.   MUSCLOSKELETAL: No history of joint pain or swelling.  No myalgias.   SKIN: Negative for lesions, rash, and itching.   HEMATOLOGY/ONCOLOGY: Negative for prolonged bleeding, bruising easily, and swollen nodes.  ENDOCRINE: Negative for cold or heat intolerance, polydipsia or goiter.   PSYCH:  +depression or anxiety symptoms.   NEURO: As Above.   Vital Signs:  BP 118/60  Pulse 64  Ht 5' 10.08" (1.78 m)  Wt 243 lb 4 oz (110.337 kg)  BMI 34.82 kg/m2  SpO2 91%   General Medical Exam:  General:  Depressed-appearing and tearful during parts of the interview .   Eyes/ENT: see cranial nerve examination.   Neck: No masses appreciated.  Full range of motion without tenderness.  No carotid bruits. Respiratory:  Clear to auscultation, good air entry bilaterally.   Cardiac:  Regular rate and rhythm, no murmur.   Back:  No pain to palpation of spinous processes.   Extremities:  No deformities, edema, or skin discoloration. Good capillary refill.   Skin:  Skin color, texture, turgor normal. No rashes or lesions.  Neurological Exam: NIHSS:  3 (-1 visual field, -1 facial asymmetry, -1 dysmetria)  MENTAL STATUS including orientation to time, place, person, recent and remote memory, attention span and concentration, language, and fund of knowledge is normal.  Speech is not dysarthric.  CRANIAL NERVES: II:  Left inferior quadrantanopia bilaterally.  Unremarkable fundi.   III-IV-VI: Pupils equal round and reactive to light.  Normal conjugate, extra-ocular eye movements in all directions of gaze.  No nystagmus.  Mild right ptosis. V:  Normal facial sensation.   VII:  Flattening of the left nasolabial fold more noticeable  at baseline and when talking. Smile appears transverse bilaterally. Facial muscles including orbicularis oris, orbicularis  oculi, and buccinator are intact.  VIII:  Normal hearing and vestibular function.   IX-X:  Normal palatal movement.   XI:  Normal shoulder shrug and head rotation.   XII:  Normal tongue strength and range of motion, no deviation or fasciculation.  MOTOR:  No atrophy, fasciculations or abnormal movements.  No pronator drift.  Tone is normal.    Right Upper Extremity:    Left Upper Extremity:    Deltoid  5/5   Deltoid  5/5   Biceps  5/5   Biceps  5/5   Triceps  5/5   Triceps  5/5   Wrist extensors  5/5   Wrist extensors  5/5   Wrist flexors  5/5   Wrist flexors  5/5   Finger extensors  5/5   Finger extensors  5/5   Finger flexors  5/5   Finger flexors  5/5   Dorsal interossei  5/5   Dorsal interossei  5/5   Abductor pollicis  5/5   Abductor pollicis  5/5   Tone (Ashworth scale)  0  Tone (Ashworth scale)  0   Right Lower Extremity:    Left Lower Extremity:    Hip flexors  5/5   Hip flexors  5/5   Hip extensors  5/5   Hip extensors  5/5   Knee flexors  5/5   Knee flexors  5/5   Knee extensors  5/5   Knee extensors  5/5   Dorsiflexors  5/5   Dorsiflexors  5/5   Plantarflexors  5/5   Plantarflexors  5/5   Toe extensors  5/5   Toe extensors  5/5   Toe flexors  5/5   Toe flexors  5/5   Tone (Ashworth scale)  0  Tone (Ashworth scale)  0   MSRs:  Right                                                                 Left brachioradialis 2+  brachioradialis 2+  biceps 2+  biceps 2+  triceps 2+  triceps 2+  patellar 2+  patellar 2+  ankle jerk 0  ankle jerk 0  Hoffman no  Hoffman no  plantar response down  plantar response down   SENSORY:  Reduced vibration distal to ankles bilaterally.  Pinprick, light touch, and temperature is intact throughout   COORDINATION/GAIT: Mild dysmetria with finger to nose testing on the left. Rapid alternating movements are intact. Gait is wide-based, slow with small steps, and assisted with a Rollator.     IMPRESSION: Jon Fields is an 80 year-old  gentleman presenting for evaluation of acute onset of left lower quadrant vision field deficits. Onset of symptoms was in mid July and has been unchanged with no improvement or worsening. His neurological exam shows left inferior quadrantanopia, left facial asymmetry and mild left-sided dysmetria. NIHSS 3  I would like to obtain brain and vessel imaging to look for subacute infarct involving the parieto-/occipital region.  If there is indeed evidence of an infarct, stroke mechanism workup will be initiated. In the meantime, he will have a Holter monitor and I will start him on statin. Goal of LDL in diabetic  patients with vascular risk factors is <70, currently LDL is 109. Vascular risk factors include diabetes (HbA1c 7.7), age, hyperlipidemia, hypertension.  Additionally he is quite tearful at today's visit, which is understandable given his visual field deficits and possible stroke. His daughter says this has become more so lately and I have asked him to continue to follow it. Patients with stroke may develop pseudobulbar palsy and if this becomes an issue, Nuedexta may be started.  He has a lot of hopelessness and I feel symptoms are most likely due to depression from being lonely, ongoing medical problems which surface this year, and new diagnosis of likely stroke.  I had an extensive discussion with the patient regarding his goals of care, because he does not seem to want any aggressive life-saving measures. His CODE STATUS is DO NOT INTUBATE and DO NOT RESUSCITATE. He has advanced directives in place and designated daughter as [place].  General guidelines for stroke risk factor management, if present  Hypertension  Target blood pressure <140/90, <130/80 for high risk; normal is 120/80  Hyperlipidemia  Target total cholesterol < 200  Target LDL <100, < 70 for high risk  Target HDL >45 for men, >55 for women  Target triglycerides <150  Diabetes  Target HgbA1c <7%    PLAN/RECOMMENDATIONS:  1.   MRI brain wo contrast ASAP, if evidence of infarct, next step is echo 2.  MRA brain and neck ASAP 3.  Start lipitor 40mg  daily.  4.  Start aspirin 81mg  daily 5.  Holter monitor 6.  Warning signs of stroke discussed, if he develops any acute neurological symptoms, patient instructed to go to the nearest emergency department 7.  Recommended patient start using Life Alert system or move into assisted living facility 8.  Return to clinic next week   The duration of this appointment visit was 50 minutes of face-to-face time with the patient.  Greater than 50% of this time was spent in counseling, explanation of diagnosis, planning of further management, and coordination of care.   Thank you for allowing me to participate in patient's care.  If I can answer any additional questions, I would be pleased to do so.    Sincerely,    Marius Betts K. Posey Pronto, DO

## 2013-11-30 NOTE — Patient Instructions (Addendum)
1.  MRI brain wo contrast 2.  MRA brain and neck 3.  Start lipitor 40mg  daily.  Your prescription has been sent. 4.  Continue aspirin 81mg  daily 5.  Holter monitor 6.  Return to clinic next week  You will always be at an increased risk of stroke.  Stroke is a medical emergency.  Know these warning signs of stroke. Every second counts:  Sudden numbness or weakness of the face, arm or leg, especially on one side of the body,  Sudden confusion, trouble speaking or understanding,  Sudden trouble seeing in one eye or both eyes,  Sudden trouble walking, dizziness, loss of balance or coordination,  Sudden severe headache with no known cause.  If you or someone with you has one or more of these signs, don't delay!  Immediately call 9-1-1, or the emergency medical services (EMS) number so an ambulance (ideally with advanced life support) can be sent for you.  Also, check the time so that you will know when the symptoms first appeared. It's very important to take immediate action. Medical treatment may be available if action is taken early enough.   General guidelines for stroke risk factor management, if present  Hypertension  Target blood pressure <140/90, <130/80 for high risk; normal is 120/80  Hyperlipidemia  Target total cholesterol < 200  Target LDL <100, < 70 for high risk  Target HDL >45 for men, >55 for women  Target triglycerides <150  Diabetes  Target HgbA1c <7%  Smoking  Target is smoking cessation  Physical inactivity  Target is exercise at least 3 times per week  Target waist circumference, in inches is <35 for women and <40 for men   [facility] [place] that can locate patients outside the home:  http://mobilealertsystems.com/ 571-200-3777 http://www.lifelinesys.com/content/lifeline-products/get-life-gosafe  607-869-7295 www.verizonwireless.com/sure/ 9042495415  Medial Alert systems that link to smart  phones:  http://www.lifelinesys.com/content/lifeline-products/response-app https://www.stanton.info/ RecycleRoad.pl.aspx  [employer] GPS Tracker:  VoiceZap.is 843-104-4519

## 2013-12-04 ENCOUNTER — Ambulatory Visit
Admission: RE | Admit: 2013-12-04 | Discharge: 2013-12-04 | Disposition: A | Discharge status: Home / Self Care | Payer: Medicare Other | Source: Ambulatory Visit | Attending: Neurology | Admitting: Neurology

## 2013-12-04 ENCOUNTER — Other Ambulatory Visit: Payer: Self-pay | Admitting: *Deleted

## 2013-12-04 ENCOUNTER — Encounter: Payer: Self-pay | Admitting: *Deleted

## 2013-12-04 DIAGNOSIS — I639 Cerebral infarction, unspecified: Secondary | ICD-10-CM

## 2013-12-04 DIAGNOSIS — I493 Ventricular premature depolarization: Secondary | ICD-10-CM

## 2013-12-04 DIAGNOSIS — R42 Dizziness and giddiness: Secondary | ICD-10-CM

## 2013-12-04 MED ORDER — GADOBENATE DIMEGLUMINE 529 MG/ML IV SOLN
10.0000 mL | Freq: Once | INTRAVENOUS | Status: AC | PRN
  Administered 2013-12-04: 10 mL via INTRAVENOUS

## 2013-12-05 ENCOUNTER — Encounter: Payer: Self-pay | Admitting: Radiology

## 2013-12-05 ENCOUNTER — Encounter (INDEPENDENT_AMBULATORY_CARE_PROVIDER_SITE_OTHER): Payer: Medicare Other

## 2013-12-05 DIAGNOSIS — I493 Ventricular premature depolarization: Secondary | ICD-10-CM

## 2013-12-05 DIAGNOSIS — R42 Dizziness and giddiness: Secondary | ICD-10-CM

## 2013-12-05 DIAGNOSIS — I639 Cerebral infarction, unspecified: Secondary | ICD-10-CM

## 2013-12-05 NOTE — Progress Notes (Signed)
Patient ID: Jon Fields, male   DOB: August 17, 1926, 80 y.o.   MRN: [id number] Lap [employer] 48 hr holter applied

## 2013-12-07 ENCOUNTER — Ambulatory Visit (INDEPENDENT_AMBULATORY_CARE_PROVIDER_SITE_OTHER): Payer: Medicare Other | Admitting: Neurology

## 2013-12-07 ENCOUNTER — Encounter: Payer: Self-pay | Admitting: Neurology

## 2013-12-07 VITALS — BP 120/64 | HR 67 | Ht 70.0 in | Wt 241.4 lb

## 2013-12-07 DIAGNOSIS — I6350 Cerebral infarction due to unspecified occlusion or stenosis of unspecified cerebral artery: Secondary | ICD-10-CM

## 2013-12-07 DIAGNOSIS — G458 Other transient cerebral ischemic attacks and related syndromes: Secondary | ICD-10-CM

## 2013-12-07 DIAGNOSIS — I639 Cerebral infarction, unspecified: Secondary | ICD-10-CM

## 2013-12-07 MED ORDER — CLOPIDOGREL BISULFATE 75 MG PO TABS: 75.0000 mg | ORAL_TABLET | Freq: Every day | ORAL | Status: DC

## 2013-12-07 NOTE — Patient Instructions (Addendum)
1.  Stop aspirin  2.  Start plavix 75mg  3.  Continue lipitor 40mg   4.  Surface echocardiogram 5.  No driving 6.  Return to clinic in 6 weeks   General guidelines for stroke risk factor management, if present  Hypertension  Target blood pressure <140/90, <130/80 for high risk; normal is 120/80  Hyperlipidemia  Target total cholesterol < 200  Target LDL <100, < 70 for high risk  Target HDL >45 for men, >55 for women  Target triglycerides <150  Diabetes  Target HgbA1c <7%

## 2013-12-07 NOTE — Progress Notes (Signed)
Follow-up Visit   Date: 12/08/2013    Jon Fields MRN: [id number] DOB: 1926-11-05   Interim History: Jon Fields is a 80 y.o. right-handed Caucasian male with history of diabetes mellitus type 2 (HbA1c 7.7), hypertension, hyperlipidemia, CAD, post-inflammatory pulmonary fibrosis, OSA, and hypothytoidism returning to the clinic for follow-up of sudden onset of left hemianopia.  The patient was accompanied to the clinic by daughter who also provides collateral information.  Patient was last seen in the clinic on 11/30/2013.  History of present illness: Starting in mid-July 2015, he was walking down a hallway at his retirement center and "it looked different", because he didn't see everything. He was in a power wheelchair last week in [place] and ran into the end of a grocery isle as he was making a right turn. He stopped driving about two weeks ago. His vision deficits include seeing things to his lower leg vision field. He is drooling a little more than before, but doesn't recall when that started. He went and had his eyes checked on 7/29 by Dr. Sharyne Peach, who found that he has bilateral visual field losses in the left lower quadrant so referred for evaluation.    He has history of Bell's palsy with right facial weakness in 2013 and recovered well from it.   He takes aspirin 81mg  three times per week because of easy bruising. He has no prior history of stroke or TIA.   He also complains of balance problems and stumbling since Spring 2015. He uses the furniture for support. He had a fall about 1.5 years ago. He has been using a cane for 1-2 years intermittently and started using a rollator about 76-months ago. He has dyspnea on exertion so asked his PCP for a powerchair.  UPDATE 12/07/2013:  Patient presents to discuss results of MRI brain which showed subacute infarct involving the right occipital region.  Vessel imaging shows intracranial atheroscloersis with R PCA branch vessel  occlusion in the area of infarct, severe mid-left P2 segment stenosis and moderate to severe L supraclinoid ICA stenosis.  He feels that his vision is slightly improving and has no new neurological symptoms.   Medications:  Current Outpatient Prescriptions on File Prior to Visit  Medication Sig Dispense Refill  . ACCU-CHEK AVIVA PLUS test strip TEST ONCE PER DAY (DIAGNOSIS IS 250.00)  100 each  3  . aspirin EC 81 MG tablet Take 81 mg by mouth every Monday, Wednesday, and Friday.      [name] atorvastatin (LIPITOR) 40 MG tablet Take 1 tablet (40 mg total) by mouth daily.  30 tablet  5  . bisoprolol (ZEBETA) 5 MG tablet Take 1 tablet (5 mg total) by mouth daily.  90 tablet  3  . buPROPion (WELLBUTRIN XL) 150 MG 24 hr tablet Take 1 tablet (150 mg total) by mouth daily.  30 tablet  5  . docusate sodium (COLACE) 100 MG capsule Take 100 mg by mouth every evening.      . furosemide (LASIX) 40 MG tablet Take 1 tablet (40 mg total) by mouth daily.  90 tablet  3  . glipiZIDE (GLUCOTROL) 10 MG tablet TAKE 1 TABLET BY MOUTH TWICE DAILY BEFORE MEALS  180 tablet  3  . ibuprofen (ADVIL,MOTRIN) 200 MG tablet Take 200 mg by mouth 2 (two) times daily as needed (pain).       . isosorbide mononitrate (IMDUR) 30 MG 24 hr tablet TAKE 1 TABLET (30 MG TOTAL) BY MOUTH DAILY.  30 tablet  6  . Lancets (ACCU-CHEK MULTICLIX) lancets Use as instructed  100 each  3  . lisinopril (PRINIVIL,ZESTRIL) 20 MG tablet Take 1 tablet by mouth  daily  90 tablet  3  . metFORMIN (GLUCOPHAGE) 500 MG tablet Take 1 tablet (500 mg total) by mouth 2 (two) times daily with a meal.  180 tablet  3  . naproxen (NAPROSYN) 500 MG tablet Take 1 tablet (500 mg total) by mouth 2 (two) times daily with a meal.  180 tablet  3  . nitroGLYCERIN (NITROSTAT) 0.4 MG SL tablet Place 1 tablet (0.4 mg total) under the tongue every 5 (five) minutes as needed for chest pain.  90 tablet  3  . OVER THE COUNTER MEDICATION Apply 1 application topically daily as needed (dry  skin and itching). Keri Lotion      . Skin Protectants, Misc. (EUCERIN) cream Apply 1 application topically daily as needed for dry skin.       [name] triamcinolone cream (KENALOG) 0.1 % Apply 1 application topically daily as needed (Rash).       . Vitamin D, Ergocalciferol, (DRISDOL) 50000 UNITS CAPS capsule Take 50,000 Units by mouth every 7 (seven) days. Take on Saturday       No current facility-administered medications on file prior to visit.    Allergies: No Known Allergies   Review of Systems:  CONSTITUTIONAL: No fevers, chills, night sweats, or weight loss.   EYES: + visual changes or eye pain ENT: No hearing changes.  No history of nose bleeds.   RESPIRATORY: No cough, wheezing and shortness of breath.   CARDIOVASCULAR: Negative for chest pain, and palpitations.   GI: Negative for abdominal discomfort, blood in stools or black stools.  No recent change in bowel habits.   GU:  No history of incontinence.   MUSCLOSKELETAL: No history of joint pain or swelling.  No myalgias.   SKIN: Negative for lesions, rash, and itching.   ENDOCRINE: Negative for cold or heat intolerance, polydipsia or goiter.   PSYCH:  + depression or anxiety symptoms.   NEURO: As Above.   Vital Signs:  BP 120/64  Pulse 67  Ht 5\' 10"  (1.778 m)  Wt 241 lb 7 oz (109.515 kg)  BMI 34.64 kg/m2  SpO2 92%   General: Tearful at times, appears depressed Neck: no carotid bruit CV: RRR  Neurological Exam: MENTAL STATUS including orientation to time, place, person, recent and remote memory, attention span and concentration, language, and fund of knowledge is normal.  Speech is not dysarthric.  CRANIAL NERVES:  Left homonymous hemianopia. Pupils equal round and reactive to light.  Normal conjugate, extra-ocular eye movements in all directions of gaze.  Mild R ptosis. Normal facial sensation. Flattening of the left nasolabial fold.  Smile appears transverse bilaterally.  Palate elevates symmetrically.  Tongue is  midline.  MOTOR:  Motor strength is 5/5 in all extremities.  No atrophy, fasciculations or abnormal movements.  No pronator drift.  Tone is normal.    MSRs:  Reflexes are 2+/4 throughout, except absent Achilles bilaterally.  SENSORY: Reduced vibration distal to ankles bilaterally  COORDINATION/GAIT:  Normal finger-to- nose-finger and heel-to-shin.  Intact rapid alternating movements bilaterally.  Gait is wide-based, slow with small steps, and assisted with a Rollator.   Data: Lab Results  Component Value Date   CHOL 157 06/29/2012   HDL 33.00* 06/29/2012   LDLCALC 106* 06/29/2012   TRIG 91.0 06/29/2012   CHOLHDL 5 06/29/2012   Lab Results  Component Value Date   HGBA1C 7.7* 11/20/2013   Lab Results  Component Value Date   TSH 1.00 11/20/2013   MRI/A brain and neck 12/04/2013: 1. Early subacute right occipital infarct.  2. Mild chronic small vessel ischemic disease, mildly progressed from the prior study.  3. Right PCA branch vessel occlusion in the area of infarct. No proximal right PCA stenosis.  4. Severe mid left P2 segment stenosis.  5. Moderate to severe left supraclinoid ICA stenosis.  6. No cervical carotid or vertebral artery stenosis.   IMPRESSION: Right occipital infarct manifested with left homonymous hemianopia (July 2015) Etiology:  Right PCA occlusion, ?intracranial atherosclerosis vs embolic  Moderate-to-severe L supraclinoid ICA stenosis, otherwise large vessels are patent Vascular risk factors:  Diabetes (HbA1c 7.7), LDL 106, HTN, age Clinically stable without any new symptoms, NIHSS 2 (visual field, flattening of nasolabial fold) Discussed management of stroke including antiplatelets, secondary risk factor prevention, and etiology work-up Patient does not want to pursue any aggressive testing such as angiogram or TEE, discussed that that these results could potentially change management which he understands  PLAN/RECOMMENDATIONS:  1.  Given his intracranial  stenosis, recommend switching to plavix 75mg  daily 1.  Continue lipitor 40mg  2.  Follow-up with Holter monitor 3.  Echo with bubble 4.  No driving 5.  Warning signs of stroke discussed  6.  Return to clinic in 6-weeks  The duration of this appointment visit was 40 minutes of face-to-face time with the patient.  Greater than 50% of this time was spent in counseling, explanation of diagnosis, planning of further management, and coordination of care.   Thank you for allowing me to participate in patient's care.  If I can answer any additional questions, I would be pleased to do so.    Sincerely,    Sorayah Schrodt K. Posey Pronto, DO

## 2013-12-08 ENCOUNTER — Encounter: Payer: Self-pay | Admitting: Neurology

## 2013-12-08 DIAGNOSIS — I639 Cerebral infarction, unspecified: Secondary | ICD-10-CM

## 2013-12-08 HISTORY — DX: Cerebral infarction, unspecified: I63.9

## 2013-12-14 ENCOUNTER — Other Ambulatory Visit (HOSPITAL_COMMUNITY): Payer: Self-pay | Admitting: Neurology

## 2013-12-14 ENCOUNTER — Ambulatory Visit (HOSPITAL_COMMUNITY): Payer: Medicare Other | Attending: Cardiovascular Disease | Admitting: Cardiology

## 2013-12-14 DIAGNOSIS — I6350 Cerebral infarction due to unspecified occlusion or stenosis of unspecified cerebral artery: Secondary | ICD-10-CM

## 2013-12-14 DIAGNOSIS — G458 Other transient cerebral ischemic attacks and related syndromes: Secondary | ICD-10-CM

## 2013-12-14 DIAGNOSIS — I639 Cerebral infarction, unspecified: Secondary | ICD-10-CM

## 2013-12-14 DIAGNOSIS — I519 Heart disease, unspecified: Secondary | ICD-10-CM | POA: Insufficient documentation

## 2013-12-14 NOTE — Progress Notes (Signed)
Echo with bubble study performed. 

## 2013-12-25 ENCOUNTER — Other Ambulatory Visit: Payer: Self-pay

## 2013-12-25 MED ORDER — CLOPIDOGREL BISULFATE 75 MG PO TABS: 75.0000 mg | ORAL_TABLET | Freq: Every day | ORAL | Status: DC

## 2013-12-25 MED ORDER — ISOSORBIDE MONONITRATE ER 30 MG PO TB24: ORAL_TABLET | ORAL | Status: DC

## 2013-12-25 MED ORDER — ATORVASTATIN CALCIUM 40 MG PO TABS: 40.0000 mg | ORAL_TABLET | Freq: Every day | ORAL | Status: AC

## 2013-12-26 ENCOUNTER — Other Ambulatory Visit: Payer: Self-pay

## 2013-12-26 MED ORDER — ISOSORBIDE MONONITRATE ER 30 MG PO TB24: ORAL_TABLET | ORAL | Status: AC

## 2013-12-26 MED ORDER — FUROSEMIDE 40 MG PO TABS: 40.0000 mg | ORAL_TABLET | Freq: Every day | ORAL | Status: AC

## 2013-12-26 MED ORDER — CLOPIDOGREL BISULFATE 75 MG PO TABS: 75.0000 mg | ORAL_TABLET | Freq: Every day | ORAL | Status: AC

## 2014-01-04 ENCOUNTER — Telehealth: Payer: Self-pay | Admitting: Cardiovascular Disease

## 2014-01-04 NOTE — Telephone Encounter (Signed)
lmom. will need to speak with Dr.Nahser and Dr.Smith to see if they are ok with pt switching providers

## 2014-01-04 NOTE — Telephone Encounter (Signed)
New problem:    Per pt's daughter's call pt would like to see dr Tamala Julian not Cathie Olden.   Can this be done for his Nov. Appointment.   Please give daughter a call to make appt.

## 2014-01-08 ENCOUNTER — Telehealth: Payer: Self-pay | Admitting: *Deleted

## 2014-01-08 NOTE — Telephone Encounter (Signed)
I called patient and let him know that I received a fax about his vision problems over the weekend.  The triage nurse recommended for him to go to the ER but he did not go.  He said there was nothing they could do.  He said that his vision was about the same but his BP is ok, he has no headache or pain.  I informed him that I would let Dr. Posey Pronto know and call him back if she has any further suggestions.

## 2014-01-08 NOTE — Telephone Encounter (Signed)
It's ok with me. 

## 2014-01-08 NOTE — Telephone Encounter (Signed)
This would not be to his advantage. Already seen by Phil, Peter Martinique and Servando Snare. I will not be able to add anything.

## 2014-01-08 NOTE — Telephone Encounter (Signed)
Called patient to discuss his symptoms, but there was no answer.  Message left on voicemail.  Donika K. Posey Pronto, DO

## 2014-01-09 NOTE — Telephone Encounter (Signed)
Spoke with patient's daughter, Tye Maryland, who states patient does not want to return to see Dr. Acie Fredrickson.  I asked if there was a specific incidence that occurred that caused their decision and she states that the patient and his son lost confidence because he is unable to do interventional cardiology.  I reviewed Dr. Elmarie Shiley note from last ov: CAD (coronary artery disease) - Thayer Headings, MD at 09/17/2013 5:22 PM     Status: Written Related Problem: CAD (coronary artery disease)    Anna has severe CAD - not amenable to PCI or CABG. He takes NTG on an as needed basis - perhaps 1-2 per week.  We discussed adding Imdur and also adding Ranexa. At this point, I think that we can continue just as we are doing. He has severe pulmonary fibrosis and is quite limited from that.  I will see him in 6 months for OV.    Daughter verbalized understanding.  I advised that Dr. Tamala Julian has replied that he does not feel that he would have anything to add to the care that has been given by Drs. Nahser, Martinique, and Hersey.  Daughter states they would like to schedule an appointment with Dr. Martinique.  I advised that I will send message to Dr. Doug Sou primary nurse, Elly Modena and that she will call them once she has had time to review with Dr. Martinique.  Daughter verbalized understanding and agreement.

## 2014-01-09 NOTE — Telephone Encounter (Signed)
Left message for patient's daughter to call the office.

## 2014-01-09 NOTE — Telephone Encounter (Signed)
F/u ° ° ° °Pt's daughter returning your call °

## 2014-01-10 NOTE — Telephone Encounter (Signed)
Returned call to patient's daughter Tye Maryland.Dr.Jordan said he would see patient.He reviewed chart and agreed with care plan given by Dr.Nahser.Appointment scheduled with Dr.Jordan 04/01/14 at 4:30 pm.

## 2014-01-12 ENCOUNTER — Other Ambulatory Visit: Payer: Self-pay | Admitting: Family Medicine

## 2014-01-15 NOTE — Telephone Encounter (Signed)
Per Dr. Sarajane Jews, okay to refill and we will get a vitamin d level next time pt is here in office.

## 2014-02-05 ENCOUNTER — Telehealth: Payer: Self-pay | Admitting: Neurology

## 2014-02-05 ENCOUNTER — Ambulatory Visit: Payer: Medicare Other | Admitting: Neurology

## 2014-02-05 NOTE — Telephone Encounter (Signed)
Pt no showed today's follow up appt w/ Dr. Posey Pronto.   Danae Chen - please send pt a no show letter / Gayleen Orem.

## 2014-02-05 NOTE — Telephone Encounter (Signed)
Please disregard pt notice of no show, pt came at the wrong time and has since been r/s / Sherri S.

## 2014-02-15 ENCOUNTER — Other Ambulatory Visit: Payer: Self-pay

## 2014-02-20 ENCOUNTER — Ambulatory Visit: Payer: Medicare Other | Admitting: Family Medicine

## 2014-03-04 IMAGING — CR DG HIP COMPLETE 2+V*R*
3 series · 3 of 3 positions shown · non-contrast
Comparison: 05/07/2008

CLINICAL DATA: Right hip pain

EXAM:
RIGHT HIP - COMPLETE 2+ VIEW

[view not recorded (1 of 3)]
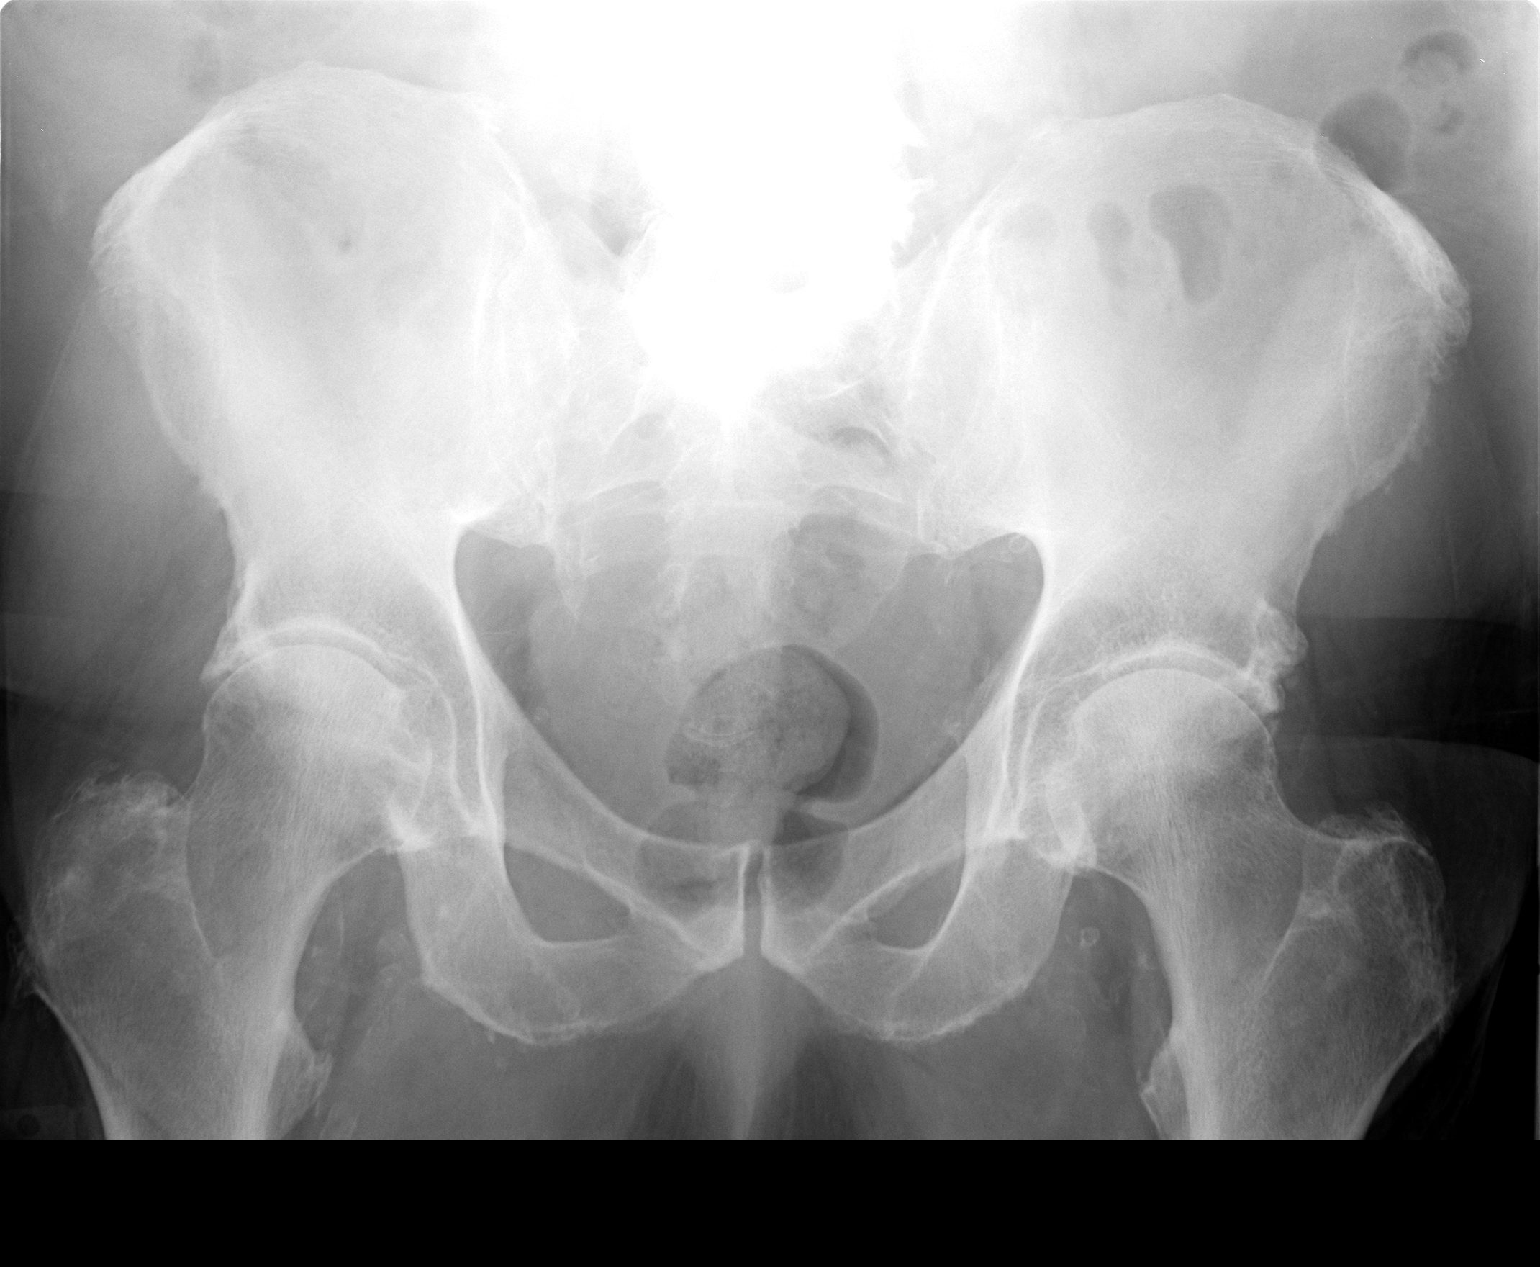

[view not recorded (2 of 3)]
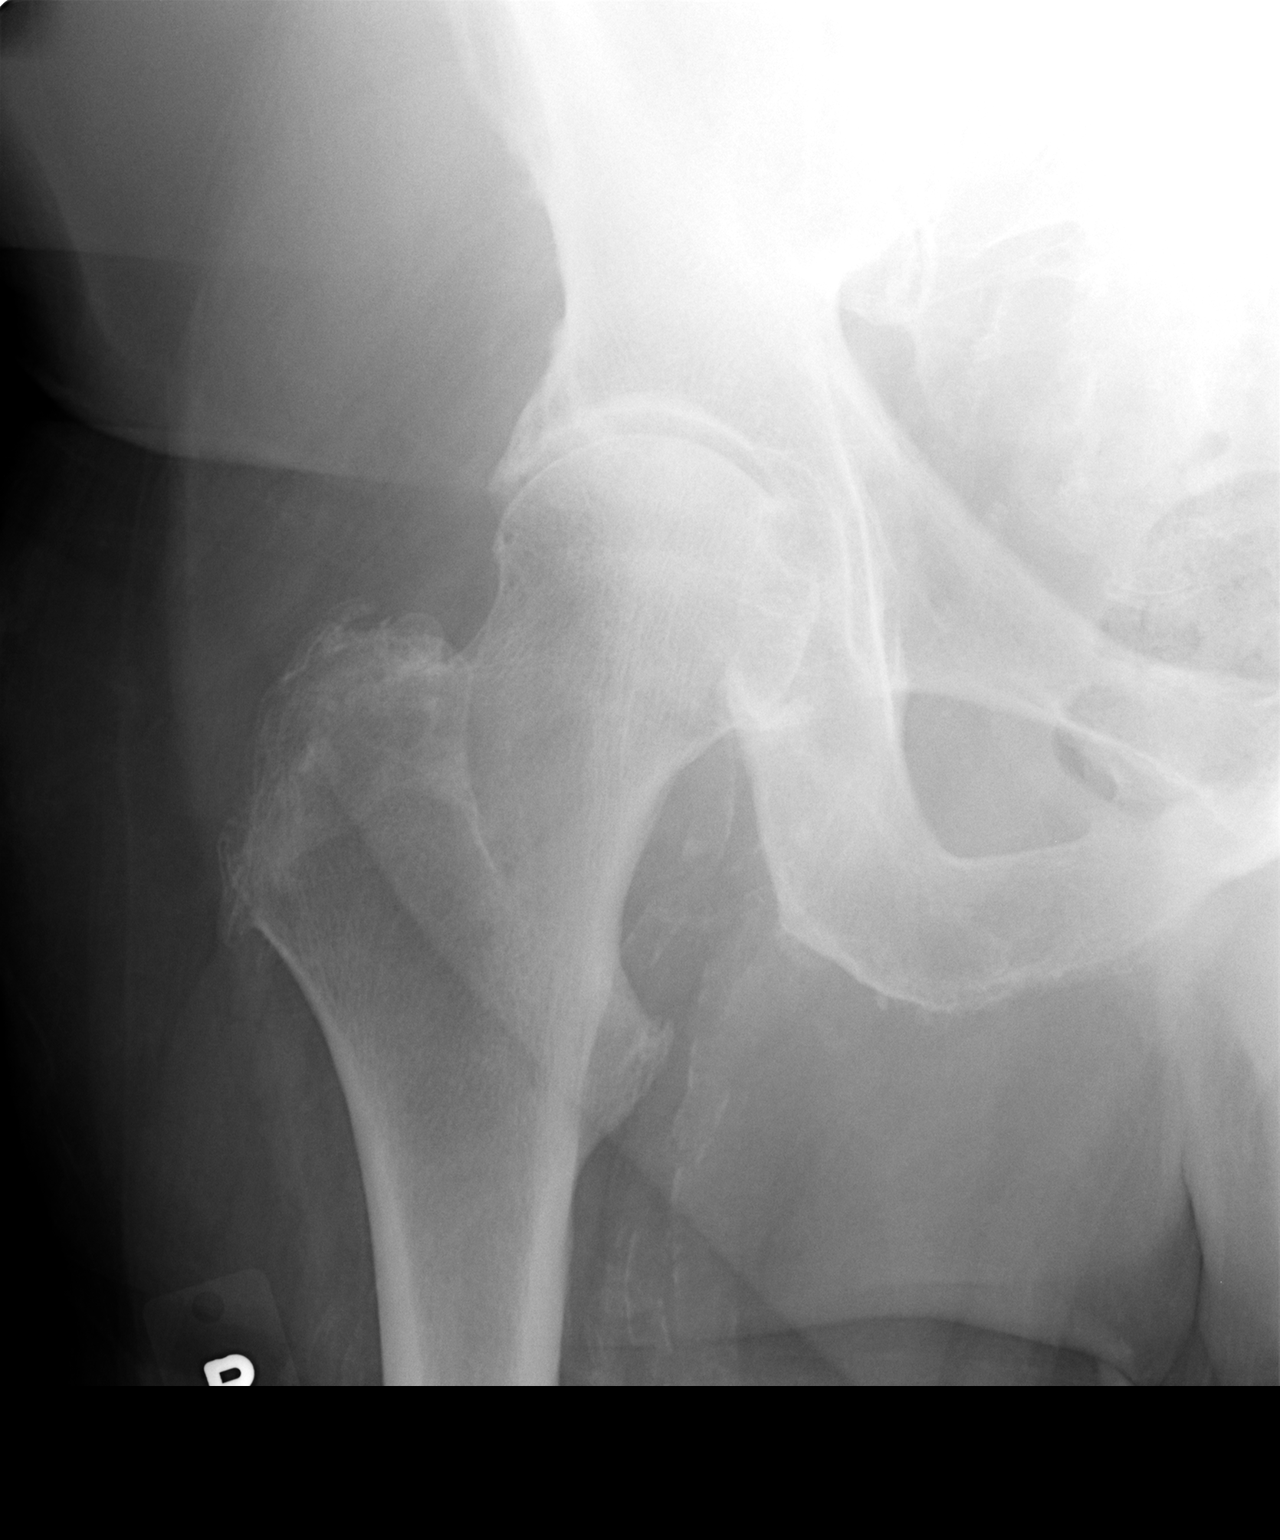

[view not recorded (3 of 3)]
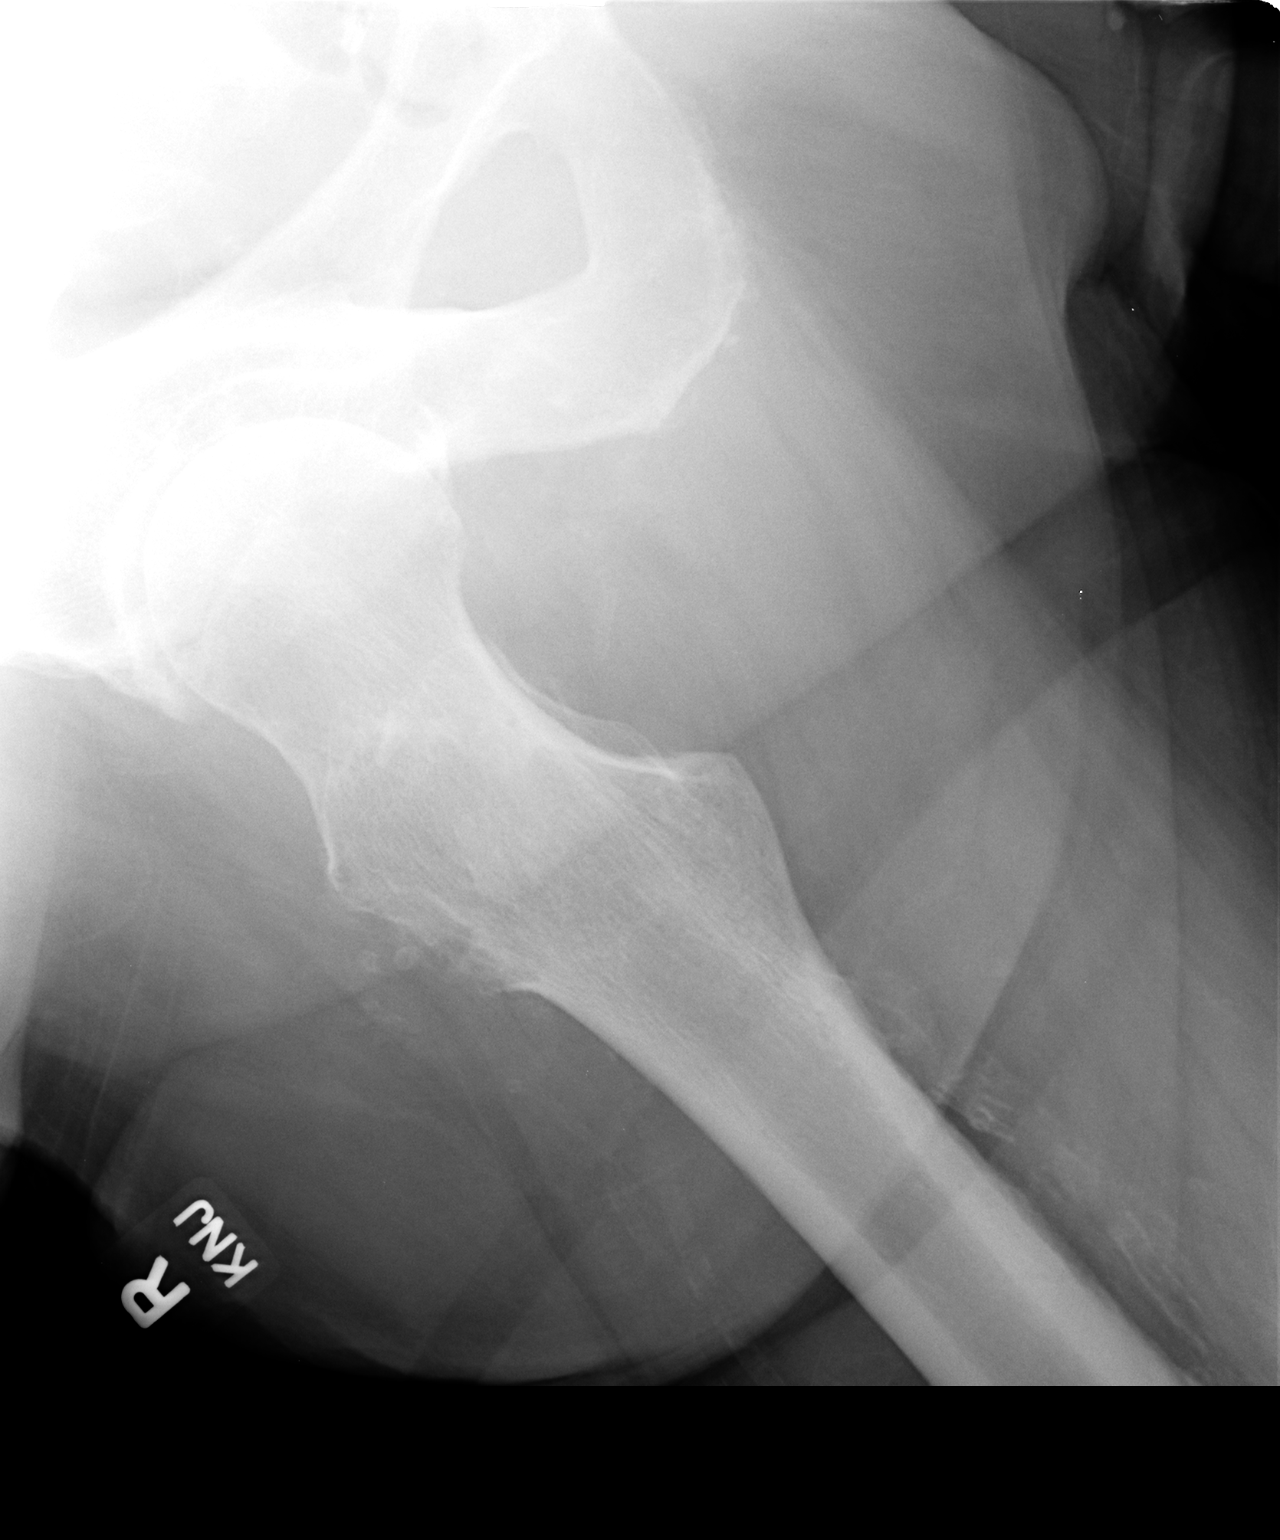

[3 of 3 positions shown; findings below may reference images not displayed]

FINDINGS: Mild cartilage loss in the right hip joint with acetabular spurring,
similar to the prior study. Negative for fracture or AVN.
Irregularity of the greater trochanter compatible with chronic
bursitis. Mild arterial calcification.
IMPRESSION: Mild to moderate degenerative change in the right hip joint, similar
to 05/07/2008. No acute abnormality.

## 2014-03-04 IMAGING — CR DG KNEE COMPLETE 4+V*R*
4 series · 4 of 4 positions shown · non-contrast
Comparison: 03/04/2010.

CLINICAL DATA: History of pain with no known injury.

RIGHT KNEE - COMPLETE 4+ VIEW

[view not recorded (1 of 4)]
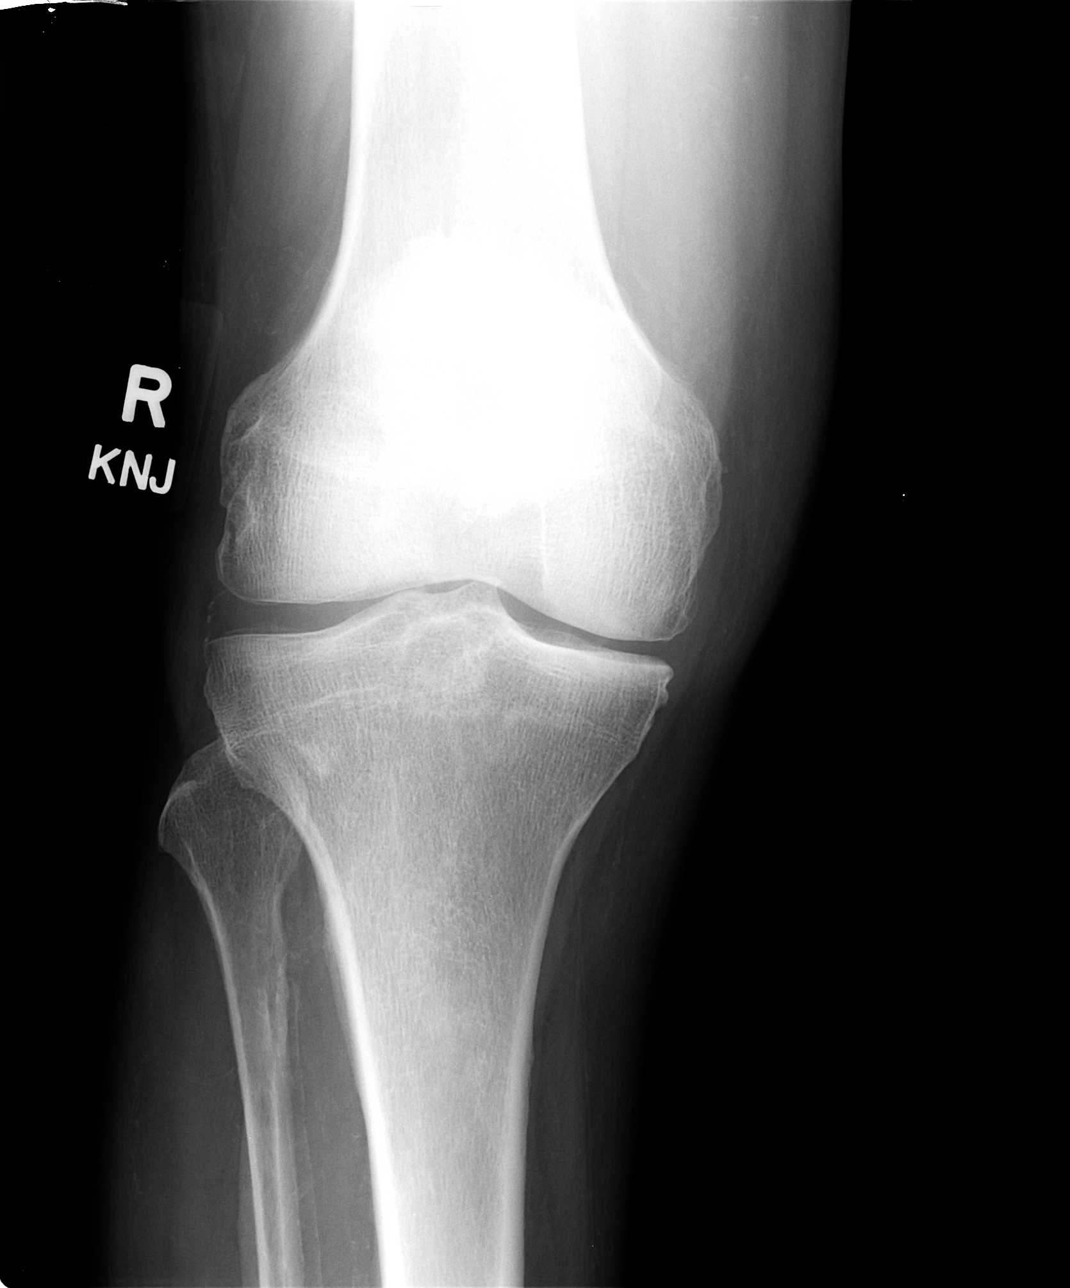

[view not recorded (2 of 4)]
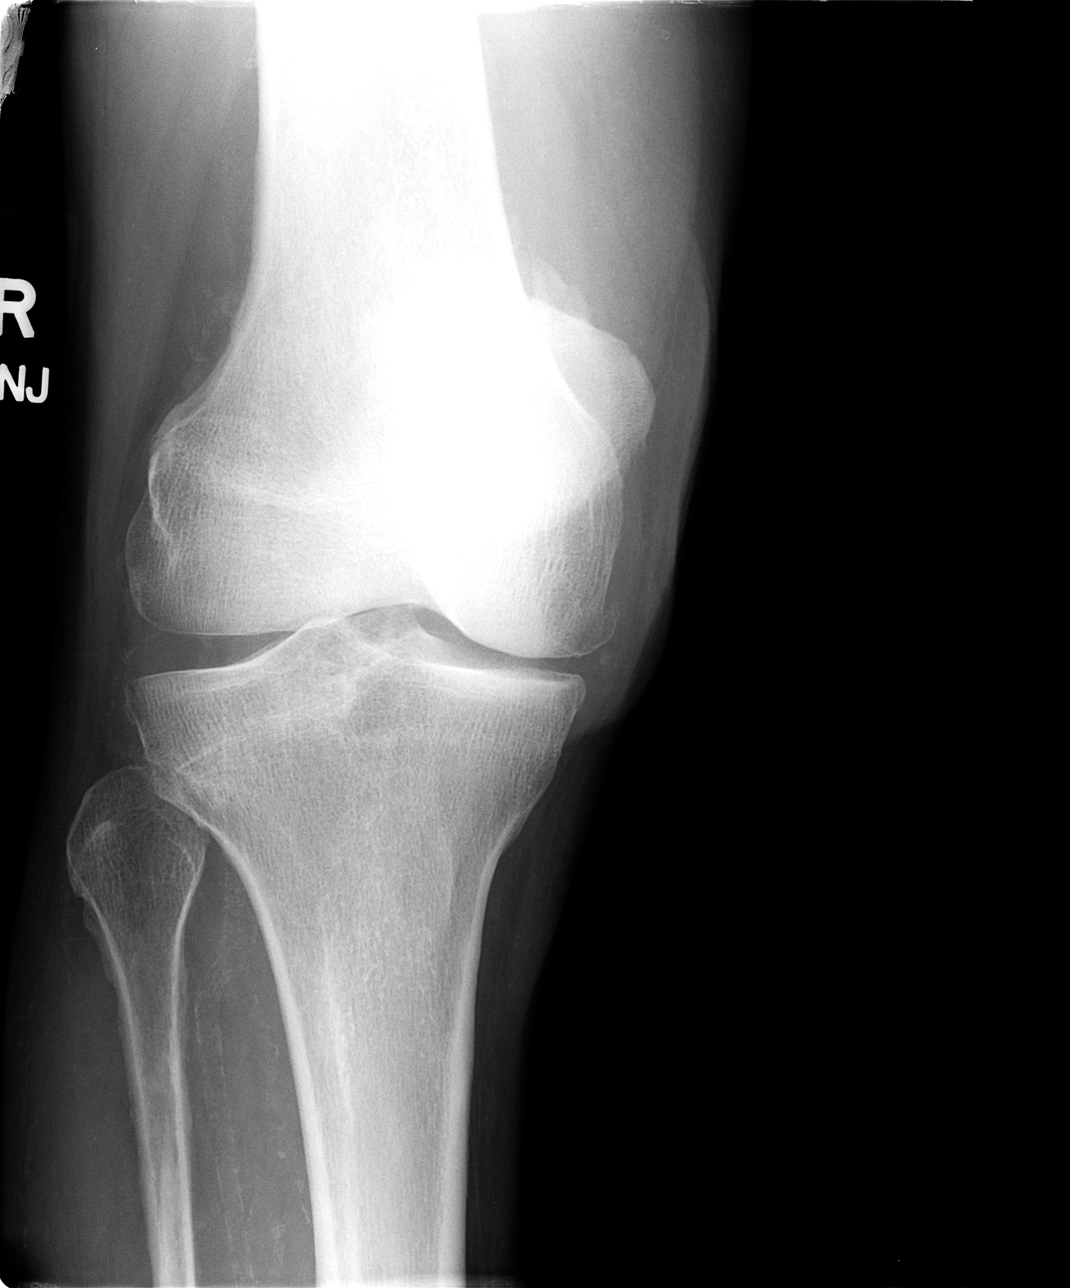

[view not recorded (3 of 4)]
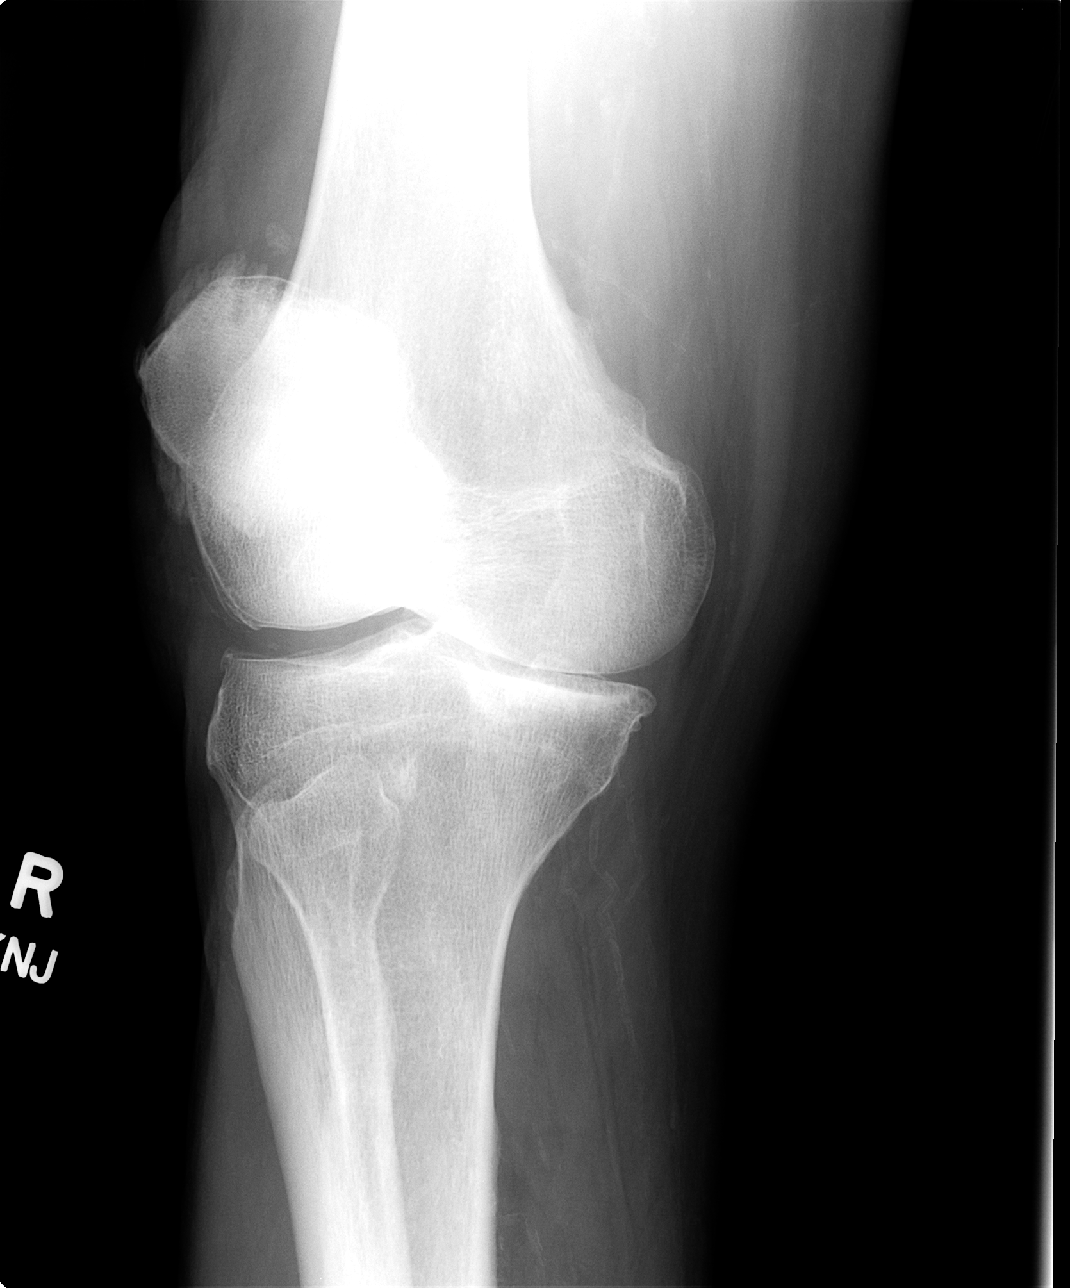

[view not recorded (4 of 4)]
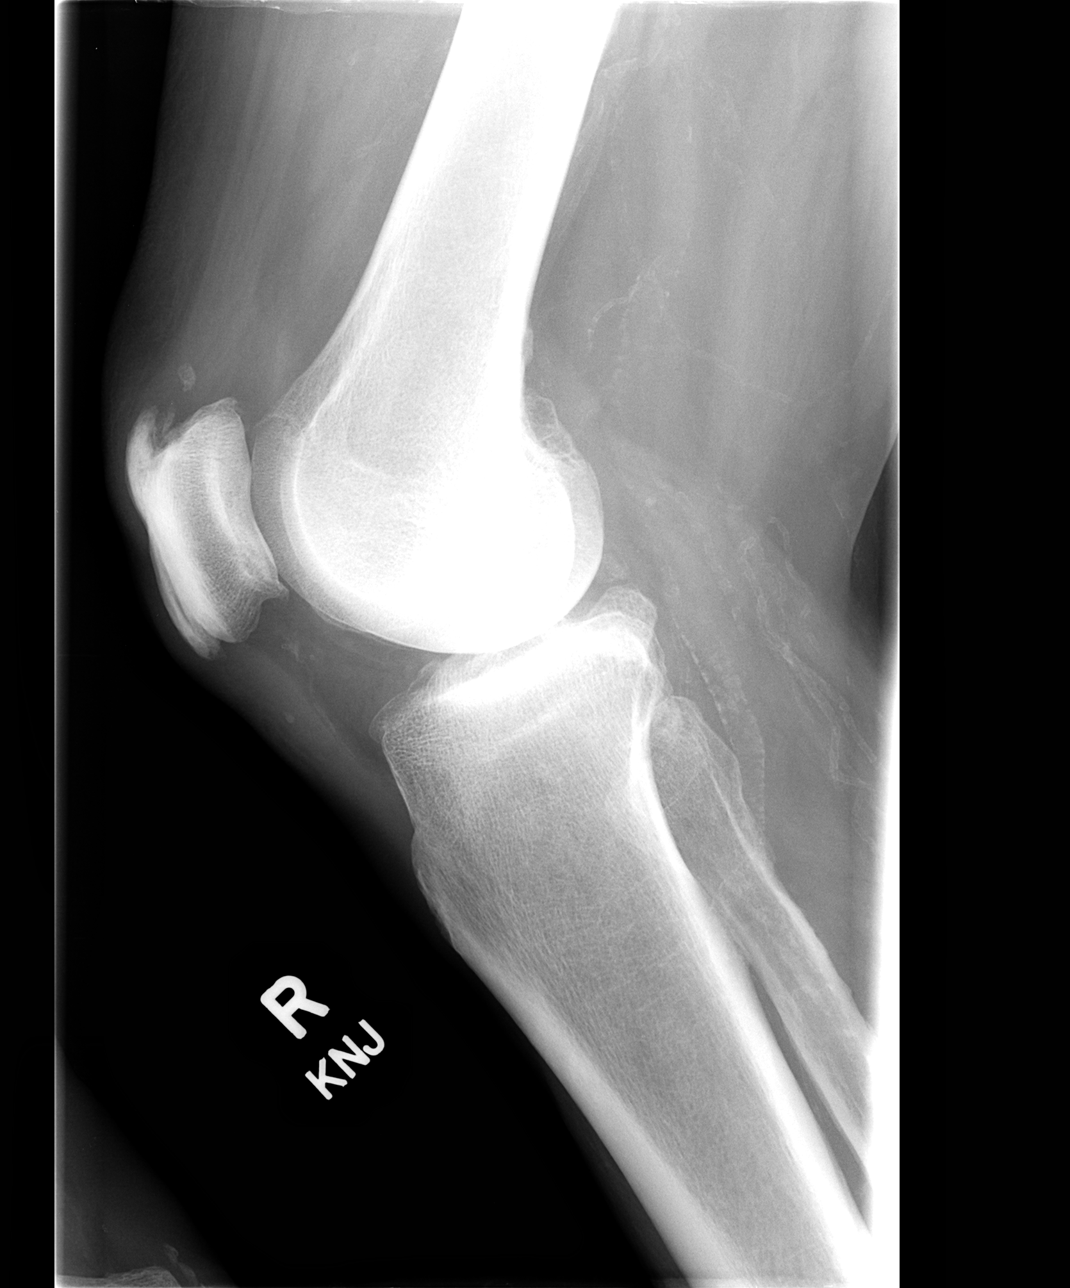

[4 of 4 positions shown; findings below may reference images not displayed]

FINDINGS: There is slight narrowing of the medial joint space and
patellofemoral joint space..  Three compartment marginal osteophyte
formation is seen representing changes of degenerative joint
osteoarthritic change.  Enthesophyte spurring of the anterior
aspect of the patella is seen. No fracture or bony destruction is
evident.  Small calcific density superior to the patella on lateral
image is unchanged from previous study and probably reflects a
phlebolith.  Nonaneurysmal arterial calcifications are present.  No
definite joint effusion is seen.
IMPRESSION: Degenerative joint osteoarthritic changes are seen.  Enthesophyte
spurring of the anterior aspect of patella.  Stable small calcific
densities appear to patella may reflect a phlebolith or previous
trauma.  From this position I feel that loose body likely.
Nonaneurysmal arterial calcifications.

## 2014-03-05 ENCOUNTER — Ambulatory Visit: Payer: Medicare Other | Admitting: Family Medicine

## 2014-03-13 ENCOUNTER — Encounter: Payer: Self-pay | Admitting: Family Medicine

## 2014-03-13 ENCOUNTER — Ambulatory Visit (INDEPENDENT_AMBULATORY_CARE_PROVIDER_SITE_OTHER): Payer: Medicare Other | Admitting: Family Medicine

## 2014-03-13 VITALS — BP 120/54 | HR 66 | Temp 97.4°F | Ht 70.0 in | Wt 242.0 lb

## 2014-03-13 DIAGNOSIS — N183 Chronic kidney disease, stage 3 unspecified: Secondary | ICD-10-CM

## 2014-03-13 DIAGNOSIS — R269 Unspecified abnormalities of gait and mobility: Secondary | ICD-10-CM

## 2014-03-13 DIAGNOSIS — Z23 Encounter for immunization: Secondary | ICD-10-CM

## 2014-03-13 DIAGNOSIS — E119 Type 2 diabetes mellitus without complications: Secondary | ICD-10-CM

## 2014-03-13 DIAGNOSIS — I10 Essential (primary) hypertension: Secondary | ICD-10-CM

## 2014-03-13 LAB — BASIC METABOLIC PANEL
BUN: 40 mg/dL — ABNORMAL HIGH (ref 6–23)
CALCIUM: 9.2 mg/dL (ref 8.4–10.5)
CO2: 19 mEq/L (ref 19–32)
Chloride: 108 mEq/L (ref 96–112)
Creatinine, Ser: 2.3 mg/dL — ABNORMAL HIGH (ref 0.4–1.5)
GFR: 28.73 mL/min — AB (ref >60.00)
GLUCOSE: 173 mg/dL — AB (ref 70–99)
POTASSIUM: 5.1 meq/L (ref 3.5–5.1)
Sodium: 141 mEq/L (ref 135–145)

## 2014-03-13 LAB — HEMOGLOBIN A1C: HEMOGLOBIN A1C: 6.9 % — AB (ref 4.6–6.5)

## 2014-03-13 NOTE — Addendum Note (Signed)
Addended by: Aggie Hacker A on: 03/13/2014 02:27 PM   Modules accepted: Orders

## 2014-03-13 NOTE — Progress Notes (Signed)
Pre visit review using our clinic review tool, if applicable. No additional management support is needed unless otherwise documented below in the visit note. 

## 2014-03-13 NOTE — Progress Notes (Signed)
   Subjective:    Patient ID: Jon Fields, male    DOB: 1926/06/12, 80 y.o.   MRN: [id number]  HPI Here for follow up. His BP and glucoses are stable. He denies chest pain or SOB but he does get tired quickly when walking short distances. His depression is stable though he still deals with sadness every day. He describes a lot of weakness in both legs and he struggles to maintain his balance. He does have moderate spinal stenosis, as evidenced by an MRI of the lumbar spine obtained last year.    Review of Systems  Respiratory: Negative.   Cardiovascular: Negative.   Musculoskeletal: Positive for gait problem.  Neurological: Positive for weakness.       Objective:   Physical Exam  Constitutional: He is oriented to person, place, and time. He appears well-developed and well-nourished.  Cardiovascular: Normal rate, normal heart sounds and intact distal pulses.   Pulmonary/Chest: Effort normal and breath sounds normal.  Neurological: He is alert and oriented to person, place, and time.  He walks slowly with a cane           Assessment & Plan:  Get labs today. We will refer to PT for leg strengthening and balance enhancement.

## 2014-03-19 ENCOUNTER — Encounter: Payer: Self-pay | Admitting: Neurology

## 2014-03-19 ENCOUNTER — Ambulatory Visit (INDEPENDENT_AMBULATORY_CARE_PROVIDER_SITE_OTHER): Payer: Medicare Other | Admitting: Neurology

## 2014-03-19 VITALS — BP 120/68 | HR 65 | Ht 70.0 in | Wt 243.0 lb

## 2014-03-19 DIAGNOSIS — I639 Cerebral infarction, unspecified: Secondary | ICD-10-CM

## 2014-03-19 DIAGNOSIS — E1142 Type 2 diabetes mellitus with diabetic polyneuropathy: Secondary | ICD-10-CM

## 2014-03-19 NOTE — Addendum Note (Signed)
Addended by: Alysia Penna A on: 03/19/2014 05:57 AM   Modules accepted: Orders

## 2014-03-19 NOTE — Patient Instructions (Addendum)
1.  Continue your medications as you are 2.  I am so happy that you are doing water exercises, keep up the good work! 3.  Encouraged to use walker and stay active 4.  Return to clinic in 68-months    [facility]  Preventing Falls in the [place] are common, often dreaded events in the lives of older people. Aside from the obvious injuries and even death that may result, falls can cause wide-ranging consequences including loss of independence, mental decline, decreased activity, and mobility. Younger people are also at risk of falling, especially those with chronic illnesses and fatigue.  Ways to reduce the risk for falling:  * Examine diet and medications. Warm foods and alcohol dilate blood vessels, which can lead to dizziness when standing. Sleep aids, antidepressants, and pain medications can also increase the likelihood of a fall.  * Get a vison exam. Poor vision, cataracts, and glaucoma increase the chances of falling.  * Check foot gear. Shoes should fit snugly and have a sturdy, nonskid sole and broad, low heel.  * Participate in a physician-approved exercise program to build and maintain muscle strength and improve balance and coordination.  * Increase vitamin D intake. Vitamin D improves muscle strength and increases the amount of calcium the body is able to absorb and deposit in bones.  How to prevent falls from common hazards:  * Floors - Remove all loose wires, cords, and throw rugs. Minimize clutter. Make sure rugs are anchored and smooth. Keep furniture in its usual place.  * Chairs - Use chairs with straight backs, armrests, and firm seats. Add firm cushions to existing pieces to add height.  * Bathroom - Install grab bars and non-skid tape in the tub or shower. Use a bathtub transfer bench or a shower chair with a back support. Use an elevated toilet seat and/or safety rails to assist standing from a low surface. Do not use towel racks or bathroom tissue holders to help  you stand.  * Lighting - Make sure halls, stairways, and entrances are well-lit. Install a night light in your bathroom or hallway. Make sure there is a light switch at the top and bottom of the staircase. Turn lights on if you get up in the middle of the night. Make sure lamps or light switches are within reach of the bed if you have to get up during the night.  * Kitchen - Install non-skid rubber mats near the sink and stove. Clean spills immediately. Store frequently used utensils, pots, and pans between waist and eye level. This helps prevent reaching and bending. Sit when getting things out of the lower cupboards.  * Living room / [place] furniture with wide spaces in between, giving enough room to move around. Establish a route through the living room that gives you something to hold onto as you walk.  * Stairs - Make sure treads, rails, and rugs are secure. Install a rail on both sides of the stairs. If stairs are a threat, it might be helpful to arrange most of your activities on the lower level to reduce the number of times you must climb the stairs.  * Entrances and doorways - Install metal handles on the walls adjacent to the doorknobs of all doors to make it more secure as you travel through the doorway.  Tips for maintaining balance:  * Keep at least one hand free at all times Try using a backpack or fanny pack to hold  things rather than carrying them in your hands. Never carry objects in both hands when walking as this interferes with keeping your balance.  * Attempt to swing both arms from front to back while walking. This might require a conscious effort if Parkinson's disease has diminished your movement. It will, however, help you to maintain balance and posture, and reduce fatigue.  * Consciously lift your feet off the ground when walking. Shuffling and dragging of the feet is a common culprit in losing your balance.  * When trying to navigate turns, use a "U" technique of  facing forward and making a wide turn, rather than pivoting sharply.  * Try to stand with your feet shoulder-length apart. When your feet are close together for any length of time, you increase your risk of losing your balance and falling.  * Do one thing at a time. Do not try to walk and accomplish another task, such as reading or looking around. The decrease in your automatic reflexes complicates motor function, so the less distraction, the better.  * Do not wear rubber or gripping soled shoes, they might "catch" on the floor and cause tripping.  * Move slowly when changing positions. Use deliberate, concentrated movements and, if needed, use a grab bar or walking aid. Count fifteen (15) seconds after standing to begin walking.  * If balance is a continuous problem, you might want to consider a walking aid such as a cane, walking stick, or walker. Once you have mastered walking with help, you may be ready to try it again on your own.  This information is provided by [facility] and is not intended to replace the medical advice of your physician or other health care providers. Please consult your physician or other health care providers for advice regarding your specific medical condition.

## 2014-03-19 NOTE — Progress Notes (Signed)
Follow-up Visit   Date: 03/19/2014    Jon Fields MRN: [id number] DOB: [birth year]   Interim History: Jon Fields is a 80 y.o. right-handed Caucasian male with history of diabetes mellitus type 2 (HbA1c 7.7), hypertension, hyperlipidemia, CAD, post-inflammatory pulmonary fibrosis, OSA, and hypothytoidism returning to the clinic for follow-up of sudden onset of left hemianopia.  The patient was accompanied to the clinic by son-in-law, Ray.  History of present illness: Starting in mid-July 2015, he was walking down a hallway at his retirement center and "it looked different", because he didn't see everything. He was in a power wheelchair last week in [place] and ran into the end of a grocery isle as he was making a right turn. He stopped driving about two weeks ago. His vision deficits include seeing things to his lower leg vision field. He is drooling a little more than before, but doesn't recall when that started. He went and had his eyes checked on 7/29 by Dr. Sharyne Peach, who found that he has bilateral visual field losses in the left lower quadrant so referred for evaluation.    He has history of Bell's palsy with right facial weakness in 2013 and recovered well from it. He takes aspirin 81mg  three times per week because of easy bruising. He has no prior history of stroke or TIA.   He also complains of balance problems and stumbling since Spring 2015. He uses the furniture for support. He had a fall about 1.5 years ago. He has been using a cane for 1-2 years intermittently and started using a rollator about 48-months ago. He has dyspnea on exertion so asked his PCP for a powerchair.  UPDATE 12/07/2013:  Patient presents to discuss results of MRI brain which showed subacute infarct involving the right occipital region.  Vessel imaging shows intracranial atheroscloersis with R PCA branch vessel occlusion in the area of infarct, severe mid-left P2 segment stenosis and moderate to  severe L supraclinoid ICA stenosis.  He feels that his vision is slightly improving and has no new neurological symptoms.  UPDATE 03/19/2014:  No improvement in vision and seems to be getting used it.  He is not running into things.  His mood is better at today's visit, but he endorses feeling sad and depressed.  A few things that has helped his mood is that he now has a power chair which has enabled him to get around [facility] and be involved in various activities better.  He is doing water exercises three times per week and enjoys it.  He continues to do his own finances.  No recent falls, although continues to have problems with balance for which he saw his PCP recent and started PT for gait training.  Medications:  Current Outpatient Prescriptions on File Prior to Visit  Medication Sig Dispense Refill  . ACCU-CHEK AVIVA PLUS test strip TEST ONCE PER DAY (DIAGNOSIS IS 250.00) 100 each 3  . aspirin EC 81 MG tablet Take 81 mg by mouth every Monday, Wednesday, and Friday.    [name] atorvastatin (LIPITOR) 40 MG tablet Take 1 tablet (40 mg total) by mouth daily. 30 tablet 11  . bisoprolol (ZEBETA) 5 MG tablet Take 1 tablet (5 mg total) by mouth daily. 90 tablet 3  . buPROPion (WELLBUTRIN XL) 150 MG 24 hr tablet Take 1 tablet (150 mg total) by mouth daily. 30 tablet 5  . clopidogrel (PLAVIX) 75 MG tablet Take 1 tablet (75 mg total) by mouth daily. 90 tablet  1  . docusate sodium (COLACE) 100 MG capsule Take 100 mg by mouth every evening.    . furosemide (LASIX) 40 MG tablet Take 1 tablet (40 mg total) by mouth daily. 90 tablet 1  . glipiZIDE (GLUCOTROL) 10 MG tablet TAKE 1 TABLET BY MOUTH TWICE DAILY BEFORE MEALS 180 tablet 3  . ibuprofen (ADVIL,MOTRIN) 200 MG tablet Take 200 mg by mouth 2 (two) times daily as needed (pain).     . isosorbide mononitrate (IMDUR) 30 MG 24 hr tablet TAKE 1 TABLET (30 MG TOTAL) BY MOUTH DAILY. 90 tablet 1  . Lancets (ACCU-CHEK MULTICLIX) lancets Use as instructed 100 each 3    . lisinopril (PRINIVIL,ZESTRIL) 20 MG tablet Take 1 tablet by mouth  daily 90 tablet 3  . metFORMIN (GLUCOPHAGE) 500 MG tablet Take 1 tablet (500 mg total) by mouth 2 (two) times daily with a meal. 180 tablet 3  . naproxen (NAPROSYN) 500 MG tablet Take 1 tablet (500 mg total) by mouth 2 (two) times daily with a meal. 180 tablet 3  . nitroGLYCERIN (NITROSTAT) 0.4 MG SL tablet Place 1 tablet (0.4 mg total) under the tongue every 5 (five) minutes as needed for chest pain. 90 tablet 3  . OVER THE COUNTER MEDICATION Apply 1 application topically daily as needed (dry skin and itching). Keri Lotion    . Skin Protectants, Misc. (EUCERIN) cream Apply 1 application topically daily as needed for dry skin.     [name] triamcinolone cream (KENALOG) 0.1 % Apply 1 application topically daily as needed (Rash).     . Vitamin D, Ergocalciferol, (DRISDOL) 50000 UNITS CAPS capsule TAKE 1 CAPSULE BY MOUTH ONCE EVERY 7 DAYS 13 capsule 0   No current facility-administered medications on file prior to visit.    Allergies: No Known Allergies   Review of Systems:  CONSTITUTIONAL: No fevers, chills, night sweats, or weight loss.   EYES: + visual changes or eye pain ENT: No hearing changes.  No history of nose bleeds.   RESPIRATORY: No cough, wheezing and shortness of breath.   CARDIOVASCULAR: Negative for chest pain, and palpitations.   GI: Negative for abdominal discomfort, blood in stools or black stools.  No recent change in bowel habits.   GU:  No history of incontinence.   MUSCLOSKELETAL: No history of joint pain or swelling.  No myalgias.   SKIN: Negative for lesions, rash, and itching.   ENDOCRINE: Negative for cold or heat intolerance, polydipsia or goiter.   PSYCH:  + depression or anxiety symptoms.   NEURO: As Above.   Vital Signs:  BP 120/68 mmHg  Pulse 65  Ht 5\' 10"  (1.778 m)  Wt 243 lb (110.224 kg)  BMI 34.87 kg/m2  SpO2 94%   General: comfortable, cheerful today  Neurological Exam: MENTAL  STATUS including orientation to time, place, person, recent and remote memory, attention span and concentration, language, and fund of knowledge is normal.  Speech is not dysarthric.  CRANIAL NERVES:  Left homonymous hemianopia. Pupils equal round and reactive to light.  Normal conjugate, extra-ocular eye movements in all directions of gaze.  Mild R ptosis. Flattening of the left nasolabial fold.  Palate elevates symmetrically.  Tongue is midline.  MOTOR:  Motor strength is 5/5 in all extremities. No pronator drift.  Tone is normal.    MSRs:  Reflexes are 2+/4 in upper extremities, 1+ at knees and absent Achilles bilaterally.  SENSORY: Reduced vibration distal to knees bilaterally  COORDINATION/GAIT:   Gait is wide-based, unsteady,  slow with small steps and unassisted.   Data: Lab Results  Component Value Date   CHOL 157 06/29/2012   HDL 33.00* 06/29/2012   LDLCALC 106* 06/29/2012   TRIG 91.0 06/29/2012   CHOLHDL 5 06/29/2012   Lab Results  Component Value Date   HGBA1C 6.9* 03/13/2014   Lab Results  Component Value Date   TSH 1.00 11/20/2013   MRI/A brain and neck 12/04/2013: 1. Early subacute right occipital infarct.  2. Mild chronic small vessel ischemic disease, mildly progressed from the prior study.  3. Right PCA branch vessel occlusion in the area of infarct. No proximal right PCA stenosis.  4. Severe mid left P2 segment stenosis.  5. Moderate to severe left supraclinoid ICA stenosis.  6. No cervical carotid or vertebral artery stenosis.  MRI lumbar spine 01/30/2013: 1. Moderate multifactorial spinal stenosis at L4-5. There is mild lateral recess and foraminal stenosis bilaterally, worse on the left due to a contributory synovial cyst projecting medially from the facet joint. 2. Mild central stenosis at L2-3 and borderline central stenosis at L3-4. No high-grade foraminal stenosis or definite right-sided nerve root encroachment. 3. Diffuse idiopathic skeletal  hyperostosis.  Echo 12/14/2013: - Left ventricle: The cavity size was normal. Wall thickness was increased in a pattern of mild LVH. Systolic function was normal. The estimated ejection fraction was in the range of 60% to 65%.  Doppler parameters are consistent with abnormal left ventricular relaxation (grade 1 diastolic dysfunction). - Mitral valve: There was mild regurgitation. - Pulmonary arteries: PA peak pressure: 34 mm Hg (S).  48-hr Holter monitor:  14 beat run of SVT, no afib  IMPRESSION: Right occipital infarct manifested with left homonymous hemianopia (July 2015) Etiology:  Right PCA occlusion, ?intracranial atherosclerosis vs embolic  Moderate-to-severe L supraclinoid ICA stenosis, otherwise large vessels are patent Vascular risk factors:  Diabetes (HbA1c 7.7), LDL 106, HTN, age Clinically stable without any new symptoms, NIHSS is stable at 2 (visual field, flattening of nasolabial fold) Discussed management of stroke including antiplatelets, secondary risk factor prevention, and etiology work-up  Gait unsteadiness, multifactorial from lumbar spinal stenosis and proprioceptive loss from diabetic neuropathy Continue PT and encouraged to use gait assist devices    PLAN/RECOMMENDATIONS:  1.  Continue plavix 75mg  daily and lipitor 40mg  2.  Encouraged to continue tight glycemic control 3.  Continue PT for gait training 4.  Fall precautions discussed 5.  Encouraged to continue water exercises 6.  No driving 7.  Warning signs of stroke discussed  8.  Return to clinic in 79-months or sooner as needed  The duration of this appointment visit was 30 minutes of face-to-face time with the patient.  Greater than 50% of this time was spent in counseling, explanation of diagnosis, planning of further management, and coordination of care.   Thank you for allowing me to participate in patient's care.  If I can answer any additional questions, I would be pleased to do so.     Sincerely,    Donika K. Posey Pronto, DO

## 2014-03-20 ENCOUNTER — Ambulatory Visit: Payer: Medicare Other | Attending: Family Medicine

## 2014-03-20 DIAGNOSIS — R269 Unspecified abnormalities of gait and mobility: Secondary | ICD-10-CM | POA: Diagnosis not present

## 2014-03-20 DIAGNOSIS — M6281 Muscle weakness (generalized): Secondary | ICD-10-CM | POA: Insufficient documentation

## 2014-03-25 ENCOUNTER — Ambulatory Visit: Payer: Medicare Other | Admitting: Physical Therapy

## 2014-03-25 ENCOUNTER — Telehealth: Payer: Self-pay | Admitting: Family Medicine

## 2014-03-25 DIAGNOSIS — R269 Unspecified abnormalities of gait and mobility: Secondary | ICD-10-CM | POA: Diagnosis not present

## 2014-03-25 NOTE — Telephone Encounter (Signed)
Pt  Has been rated a # [place] Address: [address], [place], [place] [address]  [phone] Per Suanne Marker @[facility] [phone] pt should stay of his lisinopril (PRINIVIL,ZESTRIL) 20 MG tablet  Until they can get him scheduled and all his all of his nsaids. Please inform pt per rhonda at [facility] any questions may call rhonda @ [facility]

## 2014-03-25 NOTE — Telephone Encounter (Signed)
Per the Nephrology office, tell him to stop taking Naproxen and Lisinopril until they can see him since these may be affecting his kidney function

## 2014-03-25 NOTE — Telephone Encounter (Signed)
I spoke with daughter Tye [place] and she will give pt the below information.

## 2014-04-01 ENCOUNTER — Encounter: Payer: Self-pay | Admitting: Cardiology

## 2014-04-01 ENCOUNTER — Ambulatory Visit (INDEPENDENT_AMBULATORY_CARE_PROVIDER_SITE_OTHER): Payer: Medicare Other | Admitting: Cardiology

## 2014-04-01 VITALS — BP 104/60 | HR 66 | Ht 69.0 in | Wt 246.0 lb

## 2014-04-01 DIAGNOSIS — E119 Type 2 diabetes mellitus without complications: Secondary | ICD-10-CM

## 2014-04-01 DIAGNOSIS — I25118 Atherosclerotic heart disease of native coronary artery with other forms of angina pectoris: Secondary | ICD-10-CM

## 2014-04-01 DIAGNOSIS — I10 Essential (primary) hypertension: Secondary | ICD-10-CM

## 2014-04-01 NOTE — Patient Instructions (Signed)
Continue your current therapy  Watch your salt intake  I will see you in 6 months

## 2014-04-01 NOTE — Progress Notes (Addendum)
Jon Fields Date of Birth: 07-14-1926 Medical Record #[id number]  History of Present Illness: Jon Fields is seen for follow up of CAD.  He is an 80 year old white male with history of pulmonary fibrosis, diabetes mellitus, hypertension, and hypothyroidism.  He had a prior stress Myoview study March of 2012 which was normal. He subsequently had a stress Myoview study on 03/15/2013. This was interpreted as a high risk study with medium-sized reversible defects involving the mid inferior lateral wall and apical wall segments. Ejection fraction 37%. An echocardiogram was obtained and demonstrated an ejection fraction of 50-55% with no regional wall motion abnormality. There was moderate tricuspid insufficiency and moderate pulmonary hypertension.  Following his stress test he underwent cardiac catheterization which was performed on 04/06/2013. The left main coronary was heavily calcified but had no obstructive disease. The LAD was severely calcified. There was a moderate stenosis in the mid LAD at the takeoff of the first diagonal. The LAD was occluded distally. There were bridging collaterals. The first diagonal was a small branch and was subtotally occluded. There were 2 ramus branches. The first branch had a segmental 80-90% stenosis proximally. The second branch is smaller and had moderate disease. Left circumflex was occluded. The right coronary was a large dominant vessel. There was diffuse distal disease in the posterior lateral branches. The patient was seen by Jon Fields for consideration of bypass surgery but he was felt to be poor candidate because of his comorbidities and poor target vessels. He was started on antianginal therapy with isosorbide and bisoprolol. Over the past year he has done fairly well from a cardiac standpoint. He still gets angina if he has to walk a lot and takes sl Ntg every 2-3 days with good relief. His SOB is stable. He has started using the pool at [facility] for  exercise and is able to do more in the water. He was started on lasix in February for swelling in his ankles. He does eat a moderate amount of sodium. This past summer he suffered a right occipital CVA with left homonymous hemianopia. MRI showed right PCA occlusion with intracranial atherosclerosis. Echo was unremarkable. He was started on Plavix and lipitor. More recently he was noted to have progressive renal dysfunction with creatinine up to 2.3. He was seen by nephrology and lisinopril and NSAIDs were discontinued. He also was treated with Bactrim for a UTI.   Current Outpatient Prescriptions on File Prior to Visit  Medication Sig Dispense Refill  . ACCU-CHEK AVIVA PLUS test strip TEST ONCE PER DAY (DIAGNOSIS IS 250.00) 100 each 3  . aspirin EC 81 MG tablet Take 81 mg by mouth every Monday, Wednesday, and Friday.    [name] atorvastatin (LIPITOR) 40 MG tablet Take 1 tablet (40 mg total) by mouth daily. 30 tablet 11  . bisoprolol (ZEBETA) 5 MG tablet Take 1 tablet (5 mg total) by mouth daily. 90 tablet 3  . buPROPion (WELLBUTRIN XL) 150 MG 24 hr tablet Take 1 tablet (150 mg total) by mouth daily. 30 tablet 5  . clopidogrel (PLAVIX) 75 MG tablet Take 1 tablet (75 mg total) by mouth daily. 90 tablet 1  . docusate sodium (COLACE) 100 MG capsule Take 100 mg by mouth every evening.    . furosemide (LASIX) 40 MG tablet Take 1 tablet (40 mg total) by mouth daily. 90 tablet 1  . glipiZIDE (GLUCOTROL) 10 MG tablet TAKE 1 TABLET BY MOUTH TWICE DAILY BEFORE MEALS 180 tablet 3  . ibuprofen (ADVIL,MOTRIN) 200  MG tablet Take 200 mg by mouth 2 (two) times daily as needed (pain).     . isosorbide mononitrate (IMDUR) 30 MG 24 hr tablet TAKE 1 TABLET (30 MG TOTAL) BY MOUTH DAILY. 90 tablet 1  . Lancets (ACCU-CHEK MULTICLIX) lancets Use as instructed 100 each 3  . metFORMIN (GLUCOPHAGE) 500 MG tablet Take 1 tablet (500 mg total) by mouth 2 (two) times daily with a meal. 180 tablet 3  . nitroGLYCERIN (NITROSTAT) 0.4 MG  SL tablet Place 1 tablet (0.4 mg total) under the tongue every 5 (five) minutes as needed for chest pain. 90 tablet 3  . OVER THE COUNTER MEDICATION Apply 1 application topically daily as needed (dry skin and itching). Keri Lotion    . Skin Protectants, Misc. (EUCERIN) cream Apply 1 application topically daily as needed for dry skin.     [name] triamcinolone cream (KENALOG) 0.1 % Apply 1 application topically daily as needed (Rash).     . Vitamin D, Ergocalciferol, (DRISDOL) 50000 UNITS CAPS capsule TAKE 1 CAPSULE BY MOUTH ONCE EVERY 7 DAYS 13 capsule 0   No current facility-administered medications on file prior to visit.    No Known Allergies  Past Medical History  Diagnosis Date  . Sleep apnea, obstructive     sees Dr. Danton Sewer, had sleep study 12-27-07  . Pulmonary fibrosis     sees Dr. Gwenette Greet   . Hypothyroidism   . Diabetes mellitus     type 2  . Depression   . Hyperlipidemia   . Hypertension   . Vitamin D deficiency   . Erectile dysfunction   . PVD (peripheral vascular disease)   . Osteoarthritis, generalized   . Arthritis   . Unsteady gait   . Vertebral compression fracture   . BPH with urinary obstruction     sees Dr. Gaynelle Arabian  . Diabetes mellitus type II, uncontrolled   . Bell's palsy     2013  . CAD (coronary artery disease)   . Cancer     prostate  . Stroke with cerebral ischemia 12/08/2013  . CKD (chronic kidney disease) stage 3, GFR 30-59 ml/min     Past Surgical History  Procedure Laterality Date  . Cholecystectomy    . Appendectomy    . Transurethral resection of prostate  May 2012    per Dr. Gaynelle Arabian  . Colonoscopy  06-12-02    per Dr. Lyla Son, diverticulosis only, repeat in 10  yrs     History  Smoking status  . Never Smoker   Smokeless tobacco  . Never Used    History  Alcohol Use No    Family History  Problem Relation Age of Onset  . Pancreatic cancer Sister   . Colon cancer Neg Hx   . AAA (abdominal aortic aneurysm) Father      Deceased, 13  . Heart attack Mother     Deceased, 64  . Healthy Daughter   . Healthy Sister     Review of Systems: As noted in history of present illness.  All other systems were reviewed and are negative.  Physical Exam: BP 104/60 mmHg  Pulse 66  Ht 5\' 9"  (1.753 m)  Wt 246 lb (111.585 kg)  BMI 36.31 kg/m2 He is an elderly, obese white male in no acute distress.   HEENT exam: Normocephalic, atraumatic. Pupils are equal round and reactive to light accommodation. Oropharynx is clear with good dental repair. Neck: No JVD, adenopathy, thyromegaly, or bruits. Lungs: Diffuse dry crackles.  Cardiovascular: Regular rate and rhythm. Normal S1 and S2. Soft grade 1/6 systolic murmur at the left sternal border. No gallop. Abdomen: Obese, soft, nontender. No masses or hepatosplenomegaly. Extremities: No cyanosis. Trace ankle edema. Pedal pulses are 2+. Skin: Warm and dry Neuro: Alert and oriented x3. Cranial nerves II through XII are intact. No focal findings.   LABORATORY DATA: Lab Results  Component Value Date   WBC 8.4 11/20/2013   HGB 12.4* 11/20/2013   HCT 37.0* 11/20/2013   PLT 202.0 11/20/2013   GLUCOSE 173* 03/13/2014   CHOL 157 06/29/2012   TRIG 91.0 06/29/2012   HDL 33.00* 06/29/2012   LDLCALC 106* 06/29/2012   ALT 16 02/06/2013   AST 15 02/06/2013   NA 141 03/13/2014   K 5.1 03/13/2014   CL 108 03/13/2014   CREATININE 2.3* 03/13/2014   BUN 40* 03/13/2014   CO2 19 03/13/2014   TSH 1.00 11/20/2013   PSA 0.46 06/29/2012   INR 1.01 04/05/2013   HGBA1C 6.9* 03/13/2014   MICROALBUR 6.9* 06/29/2012   Ecg today shows NSR with LAFB, RBBB, and LVH. I have personally reviewed and interpreted this study.  Echo 12/14/13:Study Conclusions  - Left ventricle: The cavity size was normal. Wall thickness was increased in a pattern of mild LVH. Systolic function was normal. The estimated ejection fraction was in the range of 60% to 65%. Doppler parameters are consistent with  abnormal left ventricular relaxation (grade 1 diastolic dysfunction). - Mitral valve: There was mild regurgitation. - Pulmonary arteries: PA peak pressure: 34 mm Hg (S).   Assessment / Plan:  1. Coronary disease with class II- III angina. Patient has complex disease based on cardiac catheterization. The left circumflex is occluded. The distal LAD is occluded. There is a small diagonal that is occluded. He does have moderate to severe disease in the mid LAD and in a ramus branch. LV function is good. I agree that he is not a surgical candidate based on his comorbidities and poor targets. For similar reasons I think that he is not an ideal candidate for PCI.will continue medical therapy with isosorbide and bisoprolol. If need be we could increase isosorbide but I would continue his current therapy for now with relatively low BP. 2. Pulmonary fibrosis 3. Diabetes mellitus type 2 4. Hypertension 5. Obesity 6. S/p right occipital CVA 7. CKD stage 3. Hopefully this will improve with above medication changes. May be able to reduce lasix dose providing he can reduce his sodium intake.   I will follow up in 6 months.

## 2014-04-02 ENCOUNTER — Ambulatory Visit: Payer: Medicare Other | Attending: Family Medicine | Admitting: Physical Therapy

## 2014-04-02 DIAGNOSIS — M6281 Muscle weakness (generalized): Secondary | ICD-10-CM | POA: Insufficient documentation

## 2014-04-02 DIAGNOSIS — R269 Unspecified abnormalities of gait and mobility: Secondary | ICD-10-CM | POA: Diagnosis present

## 2014-04-08 ENCOUNTER — Ambulatory Visit: Payer: Medicare Other

## 2014-04-09 ENCOUNTER — Other Ambulatory Visit: Payer: Self-pay | Admitting: Family Medicine

## 2014-04-11 ENCOUNTER — Encounter (HOSPITAL_COMMUNITY): Payer: Self-pay | Admitting: Cardiovascular Disease

## 2014-04-15 ENCOUNTER — Ambulatory Visit: Payer: Medicare Other | Admitting: Physical Therapy

## 2014-04-25 ENCOUNTER — Telehealth: Payer: Self-pay | Admitting: Family Medicine

## 2014-04-25 ENCOUNTER — Other Ambulatory Visit: Payer: Self-pay | Admitting: Family Medicine

## 2014-04-25 NOTE — Telephone Encounter (Signed)
Needs a copy of the form you completed for his power wheel chair mailed to him.

## 2014-04-29 ENCOUNTER — Other Ambulatory Visit: Payer: Self-pay | Admitting: Family Medicine

## 2014-04-29 NOTE — Telephone Encounter (Signed)
Yes I remember filling this form out

## 2014-04-29 NOTE — Telephone Encounter (Signed)
Do you remember filling out this form? If so I will call pt to let him know that it has not been scanned into our system yet.

## 2014-04-30 ENCOUNTER — Other Ambulatory Visit: Payer: Self-pay | Admitting: *Deleted

## 2014-04-30 MED ORDER — NITROGLYCERIN 0.4 MG SL SUBL: 0.4000 mg | SUBLINGUAL_TABLET | SUBLINGUAL | Status: DC | PRN

## 2014-04-30 NOTE — Telephone Encounter (Signed)
I put letter in the mail to pt.

## 2014-04-30 NOTE — Telephone Encounter (Signed)
The letter is ready  

## 2014-04-30 NOTE — Telephone Encounter (Signed)
I spoke with pt and he said that he needs another note on [place] Letterhead stating that he would benefit from having a power wheel chair. He has not received anything in the mail, can we rewrite and mail a copy to pt?

## 2014-05-07 ENCOUNTER — Other Ambulatory Visit: Payer: Self-pay | Admitting: *Deleted

## 2014-05-07 MED ORDER — NITROGLYCERIN 0.4 MG SL SUBL: 0.4000 mg | SUBLINGUAL_TABLET | SUBLINGUAL | Status: AC | PRN

## 2014-05-16 ENCOUNTER — Encounter (HOSPITAL_COMMUNITY): Payer: Self-pay | Admitting: *Deleted

## 2014-05-16 ENCOUNTER — Observation Stay (HOSPITAL_COMMUNITY)
Admission: EM | Admit: 2014-05-16 | Discharge: 2014-06-03 | Disposition: E | Payer: Medicare Other | Attending: Emergency Medicine | Admitting: Emergency Medicine

## 2014-05-16 ENCOUNTER — Encounter (HOSPITAL_COMMUNITY): Admission: EM | Disposition: E | Payer: Medicare Other | Source: Home / Self Care | Attending: Emergency Medicine

## 2014-05-16 DIAGNOSIS — N183 Chronic kidney disease, stage 3 (moderate): Secondary | ICD-10-CM | POA: Diagnosis not present

## 2014-05-16 DIAGNOSIS — Z7902 Long term (current) use of antithrombotics/antiplatelets: Secondary | ICD-10-CM | POA: Diagnosis not present

## 2014-05-16 DIAGNOSIS — I462 Cardiac arrest due to underlying cardiac condition: Secondary | ICD-10-CM | POA: Diagnosis not present

## 2014-05-16 DIAGNOSIS — I739 Peripheral vascular disease, unspecified: Secondary | ICD-10-CM | POA: Diagnosis not present

## 2014-05-16 DIAGNOSIS — I469 Cardiac arrest, cause unspecified: Secondary | ICD-10-CM | POA: Insufficient documentation

## 2014-05-16 DIAGNOSIS — N138 Other obstructive and reflux uropathy: Secondary | ICD-10-CM | POA: Diagnosis not present

## 2014-05-16 DIAGNOSIS — I252 Old myocardial infarction: Secondary | ICD-10-CM | POA: Insufficient documentation

## 2014-05-16 DIAGNOSIS — I25119 Atherosclerotic heart disease of native coronary artery with unspecified angina pectoris: Secondary | ICD-10-CM | POA: Insufficient documentation

## 2014-05-16 DIAGNOSIS — I5032 Chronic diastolic (congestive) heart failure: Secondary | ICD-10-CM | POA: Insufficient documentation

## 2014-05-16 DIAGNOSIS — Z8673 Personal history of transient ischemic attack (TIA), and cerebral infarction without residual deficits: Secondary | ICD-10-CM | POA: Insufficient documentation

## 2014-05-16 DIAGNOSIS — I2582 Chronic total occlusion of coronary artery: Secondary | ICD-10-CM | POA: Diagnosis not present

## 2014-05-16 DIAGNOSIS — E785 Hyperlipidemia, unspecified: Secondary | ICD-10-CM | POA: Diagnosis not present

## 2014-05-16 DIAGNOSIS — E039 Hypothyroidism, unspecified: Secondary | ICD-10-CM | POA: Diagnosis not present

## 2014-05-16 DIAGNOSIS — Z791 Long term (current) use of non-steroidal anti-inflammatories (NSAID): Secondary | ICD-10-CM | POA: Diagnosis not present

## 2014-05-16 DIAGNOSIS — I2129 ST elevation (STEMI) myocardial infarction involving other sites: Principal | ICD-10-CM

## 2014-05-16 DIAGNOSIS — R072 Precordial pain: Secondary | ICD-10-CM | POA: Diagnosis not present

## 2014-05-16 DIAGNOSIS — Z7982 Long term (current) use of aspirin: Secondary | ICD-10-CM | POA: Insufficient documentation

## 2014-05-16 DIAGNOSIS — M159 Polyosteoarthritis, unspecified: Secondary | ICD-10-CM | POA: Diagnosis not present

## 2014-05-16 DIAGNOSIS — N401 Enlarged prostate with lower urinary tract symptoms: Secondary | ICD-10-CM | POA: Diagnosis not present

## 2014-05-16 DIAGNOSIS — I2111 ST elevation (STEMI) myocardial infarction involving right coronary artery: Secondary | ICD-10-CM | POA: Diagnosis not present

## 2014-05-16 DIAGNOSIS — I129 Hypertensive chronic kidney disease with stage 1 through stage 4 chronic kidney disease, or unspecified chronic kidney disease: Secondary | ICD-10-CM | POA: Diagnosis not present

## 2014-05-16 DIAGNOSIS — G4733 Obstructive sleep apnea (adult) (pediatric): Secondary | ICD-10-CM | POA: Diagnosis not present

## 2014-05-16 DIAGNOSIS — Z8546 Personal history of malignant neoplasm of prostate: Secondary | ICD-10-CM | POA: Diagnosis not present

## 2014-05-16 DIAGNOSIS — I5033 Acute on chronic diastolic (congestive) heart failure: Secondary | ICD-10-CM | POA: Diagnosis not present

## 2014-05-16 DIAGNOSIS — I219 Acute myocardial infarction, unspecified: Secondary | ICD-10-CM | POA: Insufficient documentation

## 2014-05-16 DIAGNOSIS — I213 ST elevation (STEMI) myocardial infarction of unspecified site: Secondary | ICD-10-CM

## 2014-05-16 DIAGNOSIS — E119 Type 2 diabetes mellitus without complications: Secondary | ICD-10-CM | POA: Insufficient documentation

## 2014-05-16 DIAGNOSIS — R079 Chest pain, unspecified: Secondary | ICD-10-CM | POA: Diagnosis not present

## 2014-05-16 DIAGNOSIS — I2510 Atherosclerotic heart disease of native coronary artery without angina pectoris: Secondary | ICD-10-CM | POA: Diagnosis not present

## 2014-05-16 DIAGNOSIS — J8410 Pulmonary fibrosis, unspecified: Secondary | ICD-10-CM | POA: Insufficient documentation

## 2014-05-16 DIAGNOSIS — G510 Bell's palsy: Secondary | ICD-10-CM | POA: Insufficient documentation

## 2014-05-16 DIAGNOSIS — Z79899 Other long term (current) drug therapy: Secondary | ICD-10-CM | POA: Diagnosis not present

## 2014-05-16 HISTORY — PX: LEFT HEART CATH: SHX5478

## 2014-05-16 LAB — I-STAT CHEM 8, ED
BUN: 34 mg/dL — AB (ref 6–23)
CREATININE: 1.4 mg/dL — AB (ref 0.50–1.35)
Calcium, Ion: 1.2 mmol/L (ref 1.13–1.30)
Chloride: 103 mEq/L (ref 96–112)
GLUCOSE: 234 mg/dL — AB (ref 70–99)
HCT: 41 % (ref 39.0–52.0)
Hemoglobin: 13.9 g/dL (ref 13.0–17.0)
POTASSIUM: 4 mmol/L (ref 3.5–5.1)
SODIUM: 138 mmol/L (ref 135–145)
TCO2: 20 mmol/L (ref 0–100)

## 2014-05-16 LAB — COMPREHENSIVE METABOLIC PANEL
ALK PHOS: 109 U/L (ref 39–117)
ALT: 37 U/L (ref 0–53)
AST: 32 U/L (ref 0–37)
Albumin: 3.7 g/dL (ref 3.5–5.2)
Anion gap: 11 (ref 5–15)
BUN: 32 mg/dL — AB (ref 6–23)
CALCIUM: 9.5 mg/dL (ref 8.4–10.5)
CHLORIDE: 105 meq/L (ref 96–112)
CO2: 23 mmol/L (ref 19–32)
CREATININE: 1.55 mg/dL — AB (ref 0.50–1.35)
GFR calc Af Amer: 45 mL/min — ABNORMAL LOW (ref >90)
GFR, EST NON AFRICAN AMERICAN: 39 mL/min — AB (ref >90)
Glucose, Bld: 239 mg/dL — ABNORMAL HIGH (ref 70–99)
Potassium: 4 mmol/L (ref 3.5–5.1)
Sodium: 139 mmol/L (ref 135–145)
TOTAL PROTEIN: 7.5 g/dL (ref 6.0–8.3)
Total Bilirubin: 0.4 mg/dL (ref 0.3–1.2)

## 2014-05-16 LAB — POCT I-STAT 3, ART BLOOD GAS (G3+)
ACID-BASE DEFICIT: 19 mmol/L — AB (ref 0.0–2.0)
ACID-BASE DEFICIT: 8 mmol/L — AB (ref 0.0–2.0)
BICARBONATE: 18.4 meq/L — AB (ref 20.0–24.0)
Bicarbonate: 9.9 mEq/L — ABNORMAL LOW (ref 20.0–24.0)
O2 SAT: 95 %
O2 Saturation: 100 %
PCO2 ART: 37.8 mmHg (ref 35.0–45.0)
PH ART: 7.295 — AB (ref 7.350–7.450)
PO2 ART: 102 mmHg — AB (ref 80.0–100.0)
TCO2: 11 mmol/L (ref 0–100)
TCO2: 20 mmol/L (ref 0–100)
pCO2 arterial: 32.9 mmHg — ABNORMAL LOW (ref 35.0–45.0)
pH, Arterial: 7.086 — CL (ref 7.350–7.450)
pO2, Arterial: 335 mmHg — ABNORMAL HIGH (ref 80.0–100.0)

## 2014-05-16 LAB — I-STAT TROPONIN, ED: Troponin i, poc: 0.11 ng/mL (ref 0.00–0.08)

## 2014-05-16 LAB — CBC
HCT: 38.1 % — ABNORMAL LOW (ref 39.0–52.0)
HEMOGLOBIN: 12.5 g/dL — AB (ref 13.0–17.0)
MCH: 31.2 pg (ref 26.0–34.0)
MCHC: 32.8 g/dL (ref 30.0–36.0)
MCV: 95 fL (ref 78.0–100.0)
Platelets: 244 10*3/uL (ref 150–400)
RBC: 4.01 MIL/uL — ABNORMAL LOW (ref 4.22–5.81)
RDW: 12.9 % (ref 11.5–15.5)
WBC: 12.5 10*3/uL — ABNORMAL HIGH (ref 4.0–10.5)

## 2014-05-16 LAB — PROTIME-INR
INR: 1.02 (ref 0.00–1.49)
Prothrombin Time: 13.5 seconds (ref 11.6–15.2)

## 2014-05-16 LAB — APTT: APTT: 28 s (ref 24–37)

## 2014-05-16 LAB — POCT ACTIVATED CLOTTING TIME: ACTIVATED CLOTTING TIME: 595 s

## 2014-05-16 SURGERY — LEFT HEART CATH
Anesthesia: LOCAL | Laterality: Bilateral

## 2014-05-16 MED ORDER — SODIUM BICARBONATE 8.4 % IV SOLN
INTRAVENOUS | Status: AC
  Filled 2014-05-16: qty 50

## 2014-05-16 MED ORDER — CLOPIDOGREL BISULFATE 300 MG PO TABS
ORAL_TABLET | ORAL | Status: AC
  Filled 2014-05-16: qty 1

## 2014-05-16 MED ORDER — HEPARIN SODIUM (PORCINE) 5000 UNIT/ML IJ SOLN
INTRAMUSCULAR | Status: AC
  Filled 2014-05-16: qty 1

## 2014-05-16 MED ORDER — HEPARIN (PORCINE) IN NACL 2-0.9 UNIT/ML-% IJ SOLN
INTRAMUSCULAR | Status: AC
  Filled 2014-05-16: qty 1000

## 2014-05-16 MED ORDER — EPINEPHRINE HCL 0.1 MG/ML IJ SOSY
PREFILLED_SYRINGE | INTRAMUSCULAR | Status: AC
  Filled 2014-05-16: qty 30

## 2014-05-16 MED ORDER — SODIUM CHLORIDE 0.9 % IV SOLN
INTRAVENOUS | Status: DC
  Administered 2014-05-16: 02:00:00 via INTRAVENOUS

## 2014-05-16 MED ORDER — MIDAZOLAM HCL 2 MG/2ML IJ SOLN
INTRAMUSCULAR | Status: AC
  Filled 2014-05-16: qty 2

## 2014-05-16 MED ORDER — HEPARIN SODIUM (PORCINE) 5000 UNIT/ML IJ SOLN
4000.0000 [IU] | INTRAMUSCULAR | Status: AC
  Administered 2014-05-16: 5000 [IU] via INTRAVENOUS

## 2014-05-16 MED ORDER — BIVALIRUDIN 250 MG IV SOLR
INTRAVENOUS | Status: AC
  Filled 2014-05-16: qty 250

## 2014-05-16 MED ORDER — FENTANYL CITRATE 0.05 MG/ML IJ SOLN
INTRAMUSCULAR | Status: AC
  Filled 2014-05-16: qty 2

## 2014-05-16 MED ORDER — ATROPINE SULFATE 0.1 MG/ML IJ SOLN
INTRAMUSCULAR | Status: AC
  Filled 2014-05-16: qty 10

## 2014-05-16 MED ORDER — DOPAMINE-DEXTROSE 3.2-5 MG/ML-% IV SOLN
INTRAVENOUS | Status: AC
  Filled 2014-05-16: qty 250

## 2014-05-16 MED ORDER — LIDOCAINE HCL (PF) 1 % IJ SOLN
INTRAMUSCULAR | Status: AC
  Filled 2014-05-16: qty 30

## 2014-05-16 MED ORDER — EPINEPHRINE HCL 0.1 MG/ML IJ SOSY
PREFILLED_SYRINGE | INTRAMUSCULAR | Status: AC
  Filled 2014-05-16: qty 10

## 2014-05-16 MED ORDER — NITROGLYCERIN 1 MG/10 ML FOR IR/CATH LAB
INTRA_ARTERIAL | Status: AC
  Filled 2014-05-16: qty 10

## 2014-05-16 MED ORDER — NITROGLYCERIN IN D5W 200-5 MCG/ML-% IV SOLN
INTRAVENOUS | Status: AC
  Administered 2014-05-16: 5 ug/min
  Filled 2014-05-16: qty 250

## 2014-05-16 MED ORDER — NOREPINEPHRINE BITARTRATE 1 MG/ML IV SOLN
INTRAVENOUS | Status: AC
  Filled 2014-05-16: qty 4

## 2014-05-17 ENCOUNTER — Encounter (HOSPITAL_COMMUNITY): Payer: Self-pay | Admitting: Cardiovascular Disease

## 2014-05-17 MED FILL — Sodium Chloride IV Soln 0.9%: INTRAVENOUS | Qty: 50 | Status: AC

## 2014-06-03 NOTE — ED Notes (Signed)
At 0035 sudden onset of substernal CP 8/10. Radiated to back, pt. Was pale and diaphoretic. No N and V. En route pt. Started having nausea. 4 of zofran given, 3 nitro given, 324 aspirin iven, 4 of morphine given. On one of the EMS 12 leads ST elevation noted.

## 2014-06-03 NOTE — ED Notes (Signed)
This RN unable to obtain a second IV.

## 2014-06-03 NOTE — ED Notes (Signed)
Pt. Going up to cath lab room 8

## 2014-06-03 NOTE — ED Provider Notes (Signed)
CSN: [id number]     Arrival date & time June 01, 2014  0144 History   First MD Initiated Contact with Patient 06/01/2014 0150     Chief Complaint  Patient presents with  . Code STEMI     (Consider location/radiation/quality/duration/timing/severity/associated sxs/prior Treatment) HPI 80 year old male presents to the emergency department via his nursing home with complaint of acute onset of chest pain starting around 12:30 tonight.  Pain is substernal and goes into his back.  Pain is severe.  Patient received 3 doses of nitroglycerin and a full dose of aspirin from EMS.  Pt reports he had a "light heart attack in the past".  Denies past heart cath.  No improvement with NTG or morphine.  N/v in route.  STEMI alerted in field Past Medical History  Diagnosis Date  . Sleep apnea, obstructive     sees Dr. Danton Sewer, had sleep study 12-27-07  . Pulmonary fibrosis     sees Dr. Gwenette Greet   . Hypothyroidism   . Diabetes mellitus     type 2  . Depression   . Hyperlipidemia   . Hypertension   . Vitamin D deficiency   . Erectile dysfunction   . PVD (peripheral vascular disease)   . Osteoarthritis, generalized   . Arthritis   . Unsteady gait   . Vertebral compression fracture   . BPH with urinary obstruction     sees Dr. Gaynelle Arabian  . Diabetes mellitus type II, uncontrolled   . Bell's palsy     2013  . CAD (coronary artery disease)   . Cancer     prostate  . Stroke with cerebral ischemia 12/08/2013  . CKD (chronic kidney disease) stage 3, GFR 30-59 ml/min    Past Surgical History  Procedure Laterality Date  . Cholecystectomy    . Appendectomy    . Transurethral resection of prostate  May 2012    per Dr. Gaynelle Arabian  . Colonoscopy  06-12-02    per Dr. Lyla Son, diverticulosis only, repeat in 10  yrs   . Left heart catheterization with coronary angiogram N/A 04/06/2013    Procedure: LEFT HEART CATHETERIZATION WITH CORONARY ANGIOGRAM;  Surgeon: Ramond Dial, MD;  Location[facility] CATH LAB;   Service: Cardiovascular;  Laterality: N/A;   Family History  Problem Relation Age of Onset  . Pancreatic cancer Sister   . Colon cancer Neg Hx   . AAA (abdominal aortic aneurysm) Father     Deceased, 24  . Heart attack Mother     Deceased, 62  . Healthy Daughter   . Healthy Sister    History  Substance Use Topics  . Smoking status: Never Smoker   . Smokeless tobacco: Never Used  . Alcohol Use: No    Review of Systems  Unable to perform ROS: Acuity of condition      Allergies  Review of patient's allergies indicates no known allergies.  Home Medications   Prior to Admission medications   Medication Sig Start Date End Date Taking? Authorizing Provider  aspirin EC 81 MG tablet Take 81 mg by mouth every Monday, Wednesday, and Friday.   Yes Historical Provider, MD  bisoprolol (ZEBETA) 5 MG tablet Take 1 tablet (5 mg total) by mouth daily. 08/21/13  Yes Laurey Morale, MD  docusate sodium (COLACE) 100 MG capsule Take 100 mg by mouth every evening.   Yes Historical Provider, MD  furosemide (LASIX) 40 MG tablet Take 1 tablet (40 mg total) by mouth daily. 12/26/13  Yes  Peter M Martinique, MD  glipiZIDE (GLUCOTROL) 10 MG tablet TAKE 1 TABLET BY MOUTH TWICE DAILY BEFORE MEALS 08/21/13  Yes Laurey Morale, MD  ibuprofen (ADVIL,MOTRIN) 200 MG tablet Take 200 mg by mouth 2 (two) times daily as needed (pain).    Yes Historical Provider, MD  isosorbide mononitrate (IMDUR) 30 MG 24 hr tablet TAKE 1 TABLET (30 MG TOTAL) BY MOUTH DAILY. 12/26/13  Yes Peter M Martinique, MD  metFORMIN (GLUCOPHAGE) 500 MG tablet Take 1 tablet (500 mg total) by mouth 2 (two) times daily with a meal. 08/21/13  Yes Laurey Morale, MD  naproxen (NAPROSYN) 500 MG tablet Take 1 tablet by mouth daily. 03/18/14  Yes Historical Provider, MD  nitroGLYCERIN (NITROSTAT) 0.4 MG SL tablet Place 1 tablet (0.4 mg total) under the tongue every 5 (five) minutes as needed for chest pain. 05/07/14  Yes Peter M Martinique, MD  OVER THE COUNTER  MEDICATION Apply 1 application topically daily as needed (dry skin and itching). Keri Lotion   Yes Historical Provider, MD  Skin Protectants, Misc. (EUCERIN) cream Apply 1 application topically daily as needed for dry skin.    Yes Historical Provider, MD  triamcinolone cream (KENALOG) 0.1 % Apply 1 application topically daily as needed (Rash).  04/07/12  Yes Laurey Morale, MD  Vitamin D, Ergocalciferol, (DRISDOL) 50000 UNITS CAPS capsule TAKE 1 CAPSULE BY MOUTH ONCE EVERY 7 DAYS 04/25/14  Yes Laurey Morale, MD  ACCU-CHEK AVIVA PLUS test strip TEST ONCE PER DAY (DIAGNOSIS IS 250.00)    Laurey Morale, MD  atorvastatin (LIPITOR) 40 MG tablet Take 1 tablet (40 mg total) by mouth daily. 12/25/13   Thayer Headings, MD  buPROPion (WELLBUTRIN XL) 150 MG 24 hr tablet TAKE 1 TABLET (150 MG TOTAL) BY MOUTH DAILY. 04/29/14   Laurey Morale, MD  clopidogrel (PLAVIX) 75 MG tablet Take 1 tablet (75 mg total) by mouth daily. 12/26/13   Peter M Martinique, MD  Lancets (ACCU-CHEK MULTICLIX) lancets Use as instructed 07/18/13   Laurey Morale, MD   BP 112/73 mmHg  Pulse 66  Temp(Src) 97.8 F (36.6 C) (Oral)  Resp 19  Ht 5\' 10"  (1.778 m)  Wt 245 lb (111.131 kg)  BMI 35.15 kg/m2  SpO2 97% Physical Exam  Constitutional: He is oriented to person, place, and time. He appears well-developed and well-nourished. He appears distressed.  HENT:  Head: Normocephalic and atraumatic.  Nose: Nose normal.  Mouth/Throat: Oropharynx is clear and moist.  Eyes: Conjunctivae and EOM are normal. Pupils are equal, round, and reactive to light.  Neck: Normal range of motion. Neck supple. No JVD present. No tracheal deviation present. No thyromegaly present.  Cardiovascular: Normal rate, regular rhythm, normal heart sounds and intact distal pulses.  Exam reveals no gallop and no friction rub.   No murmur heard. Pulmonary/Chest: Effort normal and breath sounds normal. No stridor. No respiratory distress. He has no wheezes. He has no rales.  He exhibits no tenderness.  Abdominal: Soft. Bowel sounds are normal. He exhibits no distension and no mass. There is no tenderness. There is no rebound and no guarding.  Musculoskeletal: Normal range of motion. He exhibits no edema or tenderness.  Lymphadenopathy:    He has no cervical adenopathy.  Neurological: He is alert and oriented to person, place, and time. He displays normal reflexes. He exhibits normal muscle tone. Coordination normal.  Skin: Skin is warm. No rash noted. He is diaphoretic. No erythema. There is pallor.  Psychiatric: He has a normal mood and affect. His behavior is normal. Judgment and thought content normal.    ED Course  Procedures (including critical care time) Labs Review Labs Reviewed  CBC - Abnormal; Notable for the following:    WBC 12.5 (*)    RBC 4.01 (*)    Hemoglobin 12.5 (*)    HCT 38.1 (*)    All other components within normal limits  COMPREHENSIVE METABOLIC PANEL - Abnormal; Notable for the following:    Glucose, Bld 239 (*)    BUN 32 (*)    Creatinine, Ser 1.55 (*)    GFR calc non Af Amer 39 (*)    GFR calc Af Amer 45 (*)    All other components within normal limits  I-STAT TROPOININ, ED - Abnormal; Notable for the following:    Troponin i, poc 0.11 (*)    All other components within normal limits  I-STAT CHEM 8, ED - Abnormal; Notable for the following:    BUN 34 (*)    Creatinine, Ser 1.40 (*)    Glucose, Bld 234 (*)    All other components within normal limits  APTT  PROTIME-INR    Imaging Review No results found.   EKG Interpretation   Date/Time:  05/30/14 01:48:31 EST Ventricular Rate:  91 PR Interval:  212 QRS Duration: 167 QT Interval:  409 QTC Calculation: 503 R Axis:   -109 Text Interpretation:  Sinus rhythm Ventricular premature complex Prolonged  PR interval RBBB and LAFB Abnormal lateral Q waves Baseline wander in  lead(s) V1 ST depressions septal/lateral leads concerning for ischemia   Confirmed by Kaelah Hayashi  MD, Daritza Brees ([id number]) on 05/30/14 1:52:48 AM      MDM   Final diagnoses:  ST elevation myocardial infarction (STEMI), unspecified artery   81 year old male with chest pain, EKG with significant changes.  Patient reports he has not had a previous heart cath, but chart review shows that he has had heart cath within the last year showing multiple vessel disease not amenable to stenting, and thought to be poor candidate for CABG.  Dr. Aundra Dubin at bedside on call for cardiology.  Plan at this time is to take to cath lab to reevaluate coronary arteries.  Patient updated on findings and plan.  Kalman Drape, MD 2014/05/30 [phone]

## 2014-06-03 NOTE — H&P (Addendum)
Physician History and Physical    SEANPAUL PREECE MRN: [id number] DOB/AGE: [birth year] 81 y.o. Admit date: 23-May-2014  Primary Care Physician: Sarajane Jews Primary Cardiologist: [place]  HPI: 81 yo with history of diffuse CAD, pulmonary fibrosis, chronic diastolic CHF, and CKD presented to [facility] ER tonight with chest pain and was found to have lateral STEMI. Patient had Cardiolite in 11/14 that was high risk with inferolateral and apical reversible defects.  LHC in 12/14 showed severe, diffuse disease (see description in [place]).  He was evaluated by CVTS but turned down for CABG with poor functional status and poor targets. EF in 8/15 actually was preserved, 60-65%.  He has had chronic stable angina, using 2-3 NTG/week.  He gets chest pain if he walks long distances or goes up stairs fast.  Pattern had been stable.  He also has dyspnea with moderate exertion.    Tonight, patient was watching TV and developed severe substernal chest pain around 11:30.  The pain was central and 10/10.  It did not remit so he came to the ER.  NTG did not help.  He was given ASA.  ECG showed new lateral ST elevation and deep ST depression V1-V4 (on background of chronic RBBB).  This was a significant change.  TnI 0.11 and creatinine 1.4 (down from 2.3).   Meds:  Plavix 75 ASA 81 Atorvastatin 40 Bisoprolol 5 daily Lasix 40 daily Imdur 30 daily Metformin glipizide  PMH: 1. CAD: Stress Cardiolite 11/14 with EF 37%, high risk with reversible inferolateral and apical defects.  LHC 12/14 showed 50-60% ostial LAD stenosis, 80-90% stenosis LAD at D1, total occlusion LAD at D2, total occlusion LCx, RCA with mild to moderate diffuse disease.  He was evaluated for CABG and turned down because of poor targets and poor functional status.  Since then, he had chronic stable angina.  2. Ischemic cardiomyopathy: EF 37% by 11/14 Cardiolite but last echo in 8/15 with EF 60-65%, mild LVH.   3. Pulmonary fibrosis: Followed by Dr Gwenette Greet.    4. OSA 5. CKD 6. Hypothyroidism 7. CVA 2015 8. BPH 9. Bell's palsy 10. H/o prostate cancer  Review of systems complete and found to be negative unless listed above   Family History  Problem Relation Age of Onset  . Pancreatic cancer Sister   . Colon cancer Neg Hx   . AAA (abdominal aortic aneurysm) Father     Deceased, 47  . Heart attack Mother     Deceased, 73  . Healthy Daughter   . Healthy Sister     History   Social History  . Marital Status: Widowed    Spouse Name: N/A    Number of Children: N/A  . Years of Education: N/A   Occupational History  . Not on file.   Social History Main Topics  . Smoking status: Never Smoker   . Smokeless tobacco: Never Used  . Alcohol Use: No  . Drug Use: No  . Sexual Activity: Not on file   Other Topics Concern  . Not on file   Social History Narrative   He lives at [facility] since 2013.   Retired from [employer].   Highest level of education:  B.S. In mechanical engineering    Physical Exam: Blood pressure 112/73, pulse 88, temperature 97.8 F (36.6 C), temperature source Oral, resp. rate 23, height 5\' 10"  (1.778 m), weight 245 lb (111.131 kg), SpO2 97 %.  General: Uncomfortable Neck: JVP 8 cm, no thyromegaly  or thyroid nodule.  Lungs: Dependent crackles CV: Nondisplaced PMI.  Heart regular S1/S2, no S3/S4, no murmur.  1+ edema 1/2 up lower legs bilaterally.  No carotid bruit.   Abdomen: Soft, nontender, no hepatosplenomegaly, no distention.  Skin: Intact without lesions or rashes.  Neurologic: Alert and oriented x 3.  Psych: Normal affect. Extremities: No clubbing or cyanosis.  HEENT: Normal.   Labs:   Lab Results  Component Value Date   WBC 12.5* 05/26/2014   HGB 13.9 05/26/14   HCT 41.0 05-26-2014   MCV 95.0 26-May-2014   PLT 244 26-May-2014    Recent Labs Lab 05-26-2014 0207  NA 138  K 4.0  CL 103  BUN 34*  CREATININE 1.40*  GLUCOSE 234*  TnI 1.11  EKG: NSR,  RBBB, LAFB, lateral ST elevation and V1-V2 ST depression (new from prior)  ASSESSMENT AND PLAN: 81 yo with history of diffuse CAD, pulmonary fibrosis, chronic diastolic CHF, and CKD presented to [facility] ER tonight with chest pain and was found to have lateral STEMI. 1. CAD: Lateral STEMI with markedly different ECG compared to prior, unremitting chest pain.  Known severe CAD.  Poor targets for CABG on 12/14 cath.  He has been on ASA, Plavix, statin.  - Emergent coronary angiography to be done tonight.  2. CKD: Creatinine 1.4 today, down from 2.3 at prior check.  Still at significant risk for contrast nephropathy.  3. Chronic diastolic CHF: Will need repeat echo this admission.  He probably is at least mildly volume overloaded.  Likely will need diuresis.   4. Pulmonary fibrosis: followed by Dr Gwenette Greet.  5. H/o CVA 6. Diabetes: Sliding scale insulin, hold metformin.   Jon Fields May 26, 2014

## 2014-06-03 NOTE — CV Procedure (Signed)
Jon Fields is a 81 y.o. male    [id number]  [id number] LOCATION:  FACILITY: [facility]  PHYSICIAN: Troy Sine, MD, [facility] 03-13-1927   DATE OF PROCEDURE:  2014-06-02    EMERGENCY CARDIAC CATHETERIZATION/PERCUTANEOUS CORONARY INTERVENTION/DEATH NOTE     HISTORY:    Jon Fields is a 81 y.o. male with previously documented severe coronary artery disease in 2014.  At that time he was evaluated for possible CABG revascularization surgery and was turned down due to very poor functional status and very poor targets.  He has a history of significant pulmonary fibrosis, CVA, prostate cancer,  chronic kidney disease and obstructive sleep apnea.  He has had chronic anginal symptomatology.  On the evening of 05/15/2014 he developed severe substernal chest pain.  Due to continued ongoing discomfort he presented to the emergency room by EMS.  In transit, a Code STEMI was called.  He was seen by Dr. Aundra Dubin and although his ECG showed old MI.  There was concern of new significant ST elevation and he was referred for acute catheterization and possible intervention.   PROCEDURE: Left heart catheterization: coronary angiography, percutaneous coronary intervention of the RCA with PTCA/stenting, emergent, temporary pacemaker insertion, prolonged CPR, intubation and advanced life support.  The patient was brought to the [facility] cardiac catherization laboratory from the emergency room with severe pain.  He was moaning loudly and had difficult staying still.  His right femoral artery was punctured anteriorly and a 6 French sheath was inserted without difficulty.  Diagnostic catheterization was done with 5 [place] JL4 and JR4 catheters.  Only one injection was done into the subtotally occluded ostial large RCA.  There was significant catheter dampening with this injection The patient's old angiograms were reviewed and his complete total occlusion of the LAD after the diagonal vessel and ostial occlusion of  the circumflex were old and not new.  It was felt that the subtotal ostial RCA was the culprit vessel.  Angiomax bolus plus infusion was administered.  6 [place] FR4 guide with side holes was used and this was able to engage the subtotal ostial stenosis without a ventricularized pressure. An Asahi medium wire was advanced down the RCA.  ACT was therapeutic.  Initially, a 1.512 mm Euphora balloon was inserted, 3 short inflations at 8, 10, and 12 atm at the ostium were made.  This was then exchanged for a 2.012 mm Euphora balloon and 4 inflations at 8, 8, 8, and 12 atm were made of  short duration ranging from 14, 15, 16, and ultimately 25 seconds.  Emerge 2.512 mm balloon was then inserted for inflations up to 13 atm were made.  With each inflation there was excellent balloon expansion but significant recoil of the lesion, but the patient appeared to be able to tolerate the larger balloon slight increased duration without any rhythm abnormality.  Throughout the procedure.  He continued to moan and had received fentanyl 25 g for pain management.  Due to the significant recoil the decision was made to pursue stenting of the ostium. A Xience Alpine 3.015 mm stent was then inserted and advanced over the wire.  Just as the stent crossed the aortic knob and was entering the ascending aorta.  fluoroscopy shut down and there was no fluoroscopy capabilities.  Attempts were made to do all maneuvers to restore the fluoroscopy image without success.  The entire system was turned off in attempt to reboot.  Despite the waiting time for adequate rebooting, fluoroscopy was still  not available.  As result, preparation was made to move the patient from Cath Lab 5 to another Cath Lab to continue the procedure and insert the stent in the RCA ostium.  At this time, the patient began to moan more.  Careful preparation was made to leave all catheters in place and to transfer the patient off the catheterization table with all objects  remaining sterile on the patient with numerous sterile drapes.  The patient was ultimately transferred by bed to Cath Lab 3 and extreme care was made in transferring the patient from the stretcher to the Cath Lab table.  At this time, the patient began to moan more intensely and very loudly.  He was experiencing more pain and was very agitated.  Once fluoroscopy was established in Cath Lab 3 the stent was immediately positioned at the ostium and two short duration dilatations up to 14 atm for 15 and 20 seconds were performed 2.  At this point, the patient stopped breathing.  He immediately was bagged.  He became markedly bradycardic.  Atropine was immediately administered.  CPR was instituted.  The right venous sheath was inserted and a temporary pacemaker was inserted.  The patient was now in PEA.  Immediately code assistance was called.  The patient was intubated.  CPR was continued.  The pacemaker was advanced to the RV apex.  During the intensive code of 45 minutes, he received a total of 10 amps of epinephrine, atropine x2, 3 amp of bicarbonate , which increased the pH from 7.0 to 7.295, dopamine was started at 10 g and titrated to 20 g, Levofed was started at 60 g.  At this time, the patient's daughter had arrived.  She was notified of the situation.  At that time, she had stated that the patient had expressed his desire to die.  He had expressed that he did not want intubation or life support.  Both she and her brother had made the decision that if they had had a witnessed event that he would not be resuscitated.  I discussed the situation with her at length.  Since the code was in progress, we elected to continue with the attempt for approximately 10 more minutes to see if additional medications and aggressive life support measures, would make a difference.  Scout angiogram of the RCA showed the RCA to be widely patent and the stent fully deployed without residual narrowing at the ostium.  However,  despite aggressive life support measures the patient never responded.  The code commenced at approximately 4 AM and was terminated at 4:45 AM.  The patient's daughter was very appreciative to all the effort put into attempting to save her father. She felt  that he now is at peace particularly with his multiple severe coexisting comorbidities and is again with his wife who had  recently expired.   HEMODYNAMICS:   Central Aorta: Initial pressure was approximately 100/60    ANGIOGRAPHY:  Left main: Long vessel which supplied only the proximal to mid LAD.  The circumflex was not visualized.  LAD: 30% proximal stenosis before the first septal perforating artery.  Diffuse 90% stenosis in the first diagonal vessel followed by 70% LAD stenosis and 90% LAD stenosis before a small second diagonal vessel.  The LAD was then totally occluded immediately after the second diagonal vessel.  These findings were old and unchanged from the 2014 catheterization.  Left circumflex: Totally occluded at its origin from the left main (old)  Right coronary artery: Greater  than 99% ostial stenosis in a large dominant RCA with 80 and 90% distal RCA stenoses after the PDA takeoff  PCI: Following balloon dilatations with the 1.5, 2.0, and 2.5 mm balloon.  There was improvement from the greater than 99% stenosis, but continued to be significant elastic recoil.  Angiography following stent deployment showed a patent stent at the RCA ostium with resolution of the stenosis.    IMPRESSION:  ST segment elevation myocardial infarction secondary to subtotal ostial greater than 99% RCA occlusion in a patient with old total mid LAD occlusion and ostial occlusion of the left circumflex coronary artery.  Cardiac arrest with PEA and prolonged advanced life support attempt with CPR, intubation, inotropic and pharmacotherapy.  Time of death: 4:45 AM, 06/03/14.    Troy Sine, MD, [facility] 2014-06-03 10:04 PM

## 2014-06-03 DEATH — deceased

## 2014-06-19 ENCOUNTER — Ambulatory Visit: Payer: Self-pay | Admitting: Family Medicine

## 2014-07-19 ENCOUNTER — Ambulatory Visit: Payer: Medicare Other | Admitting: Pulmonary Disease

## 2014-09-17 ENCOUNTER — Ambulatory Visit: Payer: Medicare Other | Admitting: Neurology
